# Patient Record
Sex: Female | Born: 1985 | Race: White | Hispanic: No | Marital: Single | Smoking: Never smoker
Health system: Southern US, Community
[De-identification: ages and names within clinical notes are randomized; demographics above are authoritative.]

## PROBLEM LIST (undated history)

## (undated) DIAGNOSIS — R6250 Unspecified lack of expected normal physiological development in childhood: Secondary | ICD-10-CM

## (undated) DIAGNOSIS — G43909 Migraine, unspecified, not intractable, without status migrainosus: Secondary | ICD-10-CM

## (undated) HISTORY — PX: TONSILLECTOMY: SUR1361

## (undated) HISTORY — PX: CYSTECTOMY: SUR359

---
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
## 2007-12-29 ENCOUNTER — Other Ambulatory Visit: Admission: RE | Admit: 2007-12-29 | Discharge: 2007-12-29 | Payer: Self-pay | Admitting: Family Medicine

## 2009-03-03 ENCOUNTER — Emergency Department (HOSPITAL_BASED_OUTPATIENT_CLINIC_OR_DEPARTMENT_OTHER): Admission: EM | Admit: 2009-03-03 | Discharge: 2009-03-03 | Payer: Self-pay | Admitting: Emergency Medicine

## 2009-03-03 ENCOUNTER — Ambulatory Visit: Payer: Self-pay | Admitting: Diagnostic Radiology

## 2009-09-30 ENCOUNTER — Emergency Department (HOSPITAL_BASED_OUTPATIENT_CLINIC_OR_DEPARTMENT_OTHER): Admission: EM | Admit: 2009-09-30 | Discharge: 2009-09-30 | Payer: Self-pay | Admitting: Emergency Medicine

## 2009-12-08 ENCOUNTER — Ambulatory Visit: Payer: Self-pay | Admitting: Diagnostic Radiology

## 2009-12-08 ENCOUNTER — Emergency Department (HOSPITAL_BASED_OUTPATIENT_CLINIC_OR_DEPARTMENT_OTHER): Admission: EM | Admit: 2009-12-08 | Discharge: 2009-12-08 | Payer: Self-pay | Admitting: Emergency Medicine

## 2010-05-11 ENCOUNTER — Emergency Department (HOSPITAL_BASED_OUTPATIENT_CLINIC_OR_DEPARTMENT_OTHER)
Admission: EM | Admit: 2010-05-11 | Discharge: 2010-05-11 | Payer: Self-pay | Source: Home / Self Care | Admitting: Emergency Medicine

## 2011-01-29 LAB — URINALYSIS, ROUTINE W REFLEX MICROSCOPIC
Bilirubin Urine: NEGATIVE
Glucose, UA: NEGATIVE mg/dL
Ketones, ur: >80 mg/dL — AB
Nitrite: NEGATIVE
Protein, ur: NEGATIVE mg/dL
Specific Gravity, Urine: 1.021 (ref 1.005–1.030)
Urobilinogen, UA: 0.2 mg/dL (ref 0.0–1.0)
pH: 6 (ref 5.0–8.0)

## 2011-01-29 LAB — COMPREHENSIVE METABOLIC PANEL
ALT: 32 U/L (ref 0–35)
AST: 22 U/L (ref 0–37)
Albumin: 5.5 g/dL — ABNORMAL HIGH (ref 3.5–5.2)
Alkaline Phosphatase: 66 U/L (ref 39–117)
BUN: 24 mg/dL — ABNORMAL HIGH (ref 6–23)
CO2: 20 mEq/L (ref 19–32)
Calcium: 10.4 mg/dL (ref 8.4–10.5)
Chloride: 103 mEq/L (ref 96–112)
Creatinine, Ser: 0.6 mg/dL (ref 0.4–1.2)
GFR calc Af Amer: >60 mL/min (ref >60)
GFR calc non Af Amer: >60 mL/min (ref >60)
Glucose, Bld: 132 mg/dL — ABNORMAL HIGH (ref 70–99)
Potassium: 4.2 mEq/L (ref 3.5–5.1)
Sodium: 145 mEq/L (ref 135–145)
Total Bilirubin: 0.8 mg/dL (ref 0.3–1.2)
Total Protein: 8.9 g/dL — ABNORMAL HIGH (ref 6.0–8.3)

## 2011-01-29 LAB — URINE MICROSCOPIC-ADD ON

## 2011-01-29 LAB — CBC
HCT: 41.4 % (ref 36.0–46.0)
Hemoglobin: 14.2 g/dL (ref 12.0–15.0)
MCHC: 34.2 g/dL (ref 30.0–36.0)
MCV: 85.2 fL (ref 78.0–100.0)
Platelets: 308 10*3/uL (ref 150–400)
RBC: 4.86 MIL/uL (ref 3.87–5.11)
RDW: 11.4 % — ABNORMAL LOW (ref 11.5–15.5)
WBC: 9.6 10*3/uL (ref 4.0–10.5)

## 2011-01-29 LAB — DIFFERENTIAL
Basophils Absolute: 0.2 10*3/uL — ABNORMAL HIGH (ref 0.0–0.1)
Basophils Relative: 2 % — ABNORMAL HIGH (ref 0–1)
Eosinophils Absolute: 0 10*3/uL (ref 0.0–0.7)
Eosinophils Relative: 0 % (ref 0–5)
Lymphocytes Relative: 13 % (ref 12–46)
Lymphs Abs: 1.3 10*3/uL (ref 0.7–4.0)
Monocytes Absolute: 0.3 10*3/uL (ref 0.1–1.0)
Monocytes Relative: 3 % (ref 3–12)
Neutro Abs: 7.8 10*3/uL — ABNORMAL HIGH (ref 1.7–7.7)
Neutrophils Relative %: 82 % — ABNORMAL HIGH (ref 43–77)

## 2011-01-29 LAB — PREGNANCY, URINE: Preg Test, Ur: NEGATIVE

## 2011-03-15 NOTE — Op Note (Signed)
[place]. [facility]  Patient:    Bianca Scott, Bianca Scott                          MRN: [id number] Proc. Date: 05/21/01 Attending:  Dorthula Matas, D.D.S.                           Operative Report  PREOPERATIVE DIAGNOSIS:  Radiolucency of left mandible.  POSTOPERATIVE DIAGNOSIS:  Radiolucency of left mandible.  OPERATION PERFORMED:  Exploration of left mandible and removal of loose primary tooth L.  SURGEON:  Dorthula Matas, D.D.S.  ASSISTANT:  ANESTHESIA:  General orotracheal.  CULTURES:  None.  DRAINS:  None.  SPECIMENS:  Multiple fragments from intrabony cavity of the left mandible were submitted; tooth L not submitted but given to parents.  COMPLICATIONS:  None.  DESCRIPTION OF PROCEDURE:  The patient was brought to the operating room and placed on the operating table in supine position.  She was then placed under general anesthesia and a oral endotracheal tube was inserted.  The patient was maintained under general anesthesia and prepped and draped in sterile manner for oromaxillary surgical procedure with a moist throat pack being placed. 1.8 cc of 2% lidocaine with 1:100,000 epinephrine was used to infiltrate in the left mandibular vestibule as well as give a mental nerve block and to give an inferior alveolar nerve block.  At this point a 15 blade was used to make an incision along the buccal sulcus of teeth K, L, 22 and 23 with a vertical release made at the mesial aspect of 23.  A full thickness mucoperiosteal flap was reflected.  The mental foramen was identified and it was noted just anterior and just superior to the mental foramen area.  There was a darkness of the bone.  Tooth L was noted to be extremely loose and was removed just with a periosteal elevator.  The drill with a small 6 round bur was used to go through the outer cortical plate of the left mandible over the darkened area. This dropped into a bony cavity which measured 8 mm  deep and approximately 6 mm wide and was approximately 8 mm in height.  There was some old blood in this cavity and as I curetted the cavity, there were some small fragments of soft tissue which were removed and were submitted to [facility] pathology for review.  Once I had entered the cavity and opened up the area to examine it well, I then irrigated it with sterile saline.  The area was suctioned free of debris.  No further soft tissue remnants were noted.  At this point I then placed the flap back in position and used multiple interrupted 4-0 Vicryl sutures to reapproximate the soft tissue flap.  At this point the surgical procedure was completed.  The oral cavity was irrigated and suctioned free of fluid.  The throat pack was removed.  The patient was awakened in the operating room and transferred to the recovery room where she was found to be in satisfactory postoperative condition. DD:  05/21/01 TD:  05/21/01 Job: [id number]

## 2011-03-15 NOTE — Op Note (Signed)
[place]. [facility]  Patient:    ZAKYRIA, METZINGER                         MRN: [id number] Proc. Date: 05/21/01 Adm. Date:  05/21/01 Attending:  Dorthula Matas, D.D.S.                           Operative Report  PLACE OF SERVICE:  [facility].  SURGEON:  Golden Pop, D.D.S.  BRIEF HISTORY:  Lycia Sachdeva is a 25 year old white female who has a radiolucency of the left mandible as noted by her orthodontist on a [facility] x-ray.  She was referred to me and we confirmed with a periapical x-ray that in fact there was a enlarged radiolucency in the area of tooth #22, and #21.  Also, noted was that primary tooth L was loose and I encouraged the patient to try to remove this tooth prior to the surgery.  I also discussed with the parents that if the tooth is not gone I would probably remove it because it was so loose I was afraid that she might aspirate it in the immediate post surgical time.  Sarah, in general, is in fairly good health but she has been known to have some chronic recurring headaches.  She also has been followed by Dr. Alden Server L. Peter [facility], but apparently she mentally is not in the same age range as she is developmentally.  She is not a candidate to have the surgical procedure done in the office and she also has somewhat of a restricted opening and very tight perioral musculature.  The plan is to explore the left mandible and if this is a cyst, to remove it.  At this point I suspect a dentigerous cyst or a traumatic bone cyst.  There is also a good chance that we will removing primary tooth L since it is loose if the patient has not already taken it out.  The patient will be seen by me postoperatively for follow up. DD:  05/21/01 TD:  05/21/01 Job: [id number]

## 2011-04-17 ENCOUNTER — Emergency Department (HOSPITAL_BASED_OUTPATIENT_CLINIC_OR_DEPARTMENT_OTHER)
Admission: EM | Admit: 2011-04-17 | Discharge: 2011-04-17 | Disposition: A | Discharge status: Home / Self Care | Payer: 59 | Attending: Emergency Medicine | Admitting: Emergency Medicine

## 2011-04-17 ENCOUNTER — Emergency Department (INDEPENDENT_AMBULATORY_CARE_PROVIDER_SITE_OTHER): Payer: 59

## 2011-04-17 DIAGNOSIS — R059 Cough, unspecified: Secondary | ICD-10-CM | POA: Insufficient documentation

## 2011-04-17 DIAGNOSIS — R05 Cough: Secondary | ICD-10-CM | POA: Insufficient documentation

## 2011-04-17 DIAGNOSIS — M549 Dorsalgia, unspecified: Secondary | ICD-10-CM | POA: Insufficient documentation

## 2011-04-17 LAB — URINALYSIS, ROUTINE W REFLEX MICROSCOPIC
Bilirubin Urine: NEGATIVE
Glucose, UA: NEGATIVE mg/dL
Hgb urine dipstick: NEGATIVE
Ketones, ur: NEGATIVE mg/dL
Nitrite: NEGATIVE
Protein, ur: NEGATIVE mg/dL
Specific Gravity, Urine: 1.011 (ref 1.005–1.030)
Urobilinogen, UA: 0.2 mg/dL (ref 0.0–1.0)
pH: 7 (ref 5.0–8.0)

## 2011-04-17 LAB — URINE MICROSCOPIC-ADD ON

## 2011-04-17 LAB — PREGNANCY, URINE: Preg Test, Ur: NEGATIVE

## 2011-07-11 ENCOUNTER — Emergency Department (INDEPENDENT_AMBULATORY_CARE_PROVIDER_SITE_OTHER): Payer: 59

## 2011-07-11 ENCOUNTER — Encounter: Payer: Self-pay | Admitting: *Deleted

## 2011-07-11 ENCOUNTER — Emergency Department (HOSPITAL_BASED_OUTPATIENT_CLINIC_OR_DEPARTMENT_OTHER)
Admission: EM | Admit: 2011-07-11 | Discharge: 2011-07-11 | Disposition: A | Discharge status: Home / Self Care | Payer: 59 | Attending: Emergency Medicine | Admitting: Emergency Medicine

## 2011-07-11 DIAGNOSIS — Y9355 Activity, bike riding: Secondary | ICD-10-CM | POA: Insufficient documentation

## 2011-07-11 DIAGNOSIS — S9030XA Contusion of unspecified foot, initial encounter: Secondary | ICD-10-CM

## 2011-07-11 DIAGNOSIS — M25579 Pain in unspecified ankle and joints of unspecified foot: Secondary | ICD-10-CM

## 2011-07-11 DIAGNOSIS — IMO0002 Reserved for concepts with insufficient information to code with codable children: Secondary | ICD-10-CM | POA: Insufficient documentation

## 2011-07-11 DIAGNOSIS — X58XXXA Exposure to other specified factors, initial encounter: Secondary | ICD-10-CM

## 2011-07-11 DIAGNOSIS — Y9289 Other specified places as the place of occurrence of the external cause: Secondary | ICD-10-CM | POA: Insufficient documentation
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
## 2011-07-11 NOTE — ED Notes (Signed)
Pt reports injuring right foot last week large bruised area noted on top of right foot pt limping states kept thinking it would get better but is still very painful

## 2011-07-11 NOTE — ED Provider Notes (Signed)
History     CSN: [id number] Arrival date & time: 07/11/2011  5:55 PM   Chief Complaint  Patient presents with  . Foot Injury     (Include location/radiation/quality/duration/timing/severity/associated sxs/prior treatment) Patient is a 25 y.o. female presenting with foot injury. The history is provided by the patient. No language interpreter was used.  Foot Injury  The incident occurred more than 1 week ago. The incident occurred at home. There was no injury mechanism. The pain is present in the right foot. The pain is at a severity of 7/10. The pain is moderate. The pain has been constant since onset. She reports no foreign bodies present. She has tried nothing for the symptoms. The treatment provided moderate relief.  Pt complains of pain in [place].  Pt complains of bruising and swelling.   History reviewed. No pertinent past medical history.   Past Surgical History  Procedure Date  . Cystectomy   . Tonsillectomy     No family history on file.  History  Substance Use Topics  . Smoking status: Never Smoker   . Smokeless tobacco: Not on file  . Alcohol Use: Yes    OB History    Grav Para Term Preterm Abortions TAB SAB Ect Mult Living                  Review of Systems  Musculoskeletal: Positive for myalgias and joint swelling.  All other systems reviewed and are negative.    Allergies  Ventolin and Duricef  Home Medications   Current Outpatient Rx  Name Route Sig Dispense Refill  . AMOXICILLIN 125 MG PO CHEW Oral Chew 125 mg by mouth 3 (three) times daily. Unknown dosage     . AMOXICILLIN PO Oral Take 1 capsule by mouth 2 (two) times daily.      . IBUPROFEN 200 MG PO TABS Oral Take 400 mg by mouth 2 (two) times daily as needed. For pain     . CYPROHEPTADINE HCL 4 MG PO TABS Oral Take 4 mg by mouth 2 (two) times daily as needed. For migraine       Physical Exam    BP 123/72  Pulse 84  Temp(Src) 98.6 F (37  C) (Oral)  Resp 16  SpO2 100%  LMP 06/21/2011  Physical Exam  Nursing note and vitals reviewed. Constitutional: She is oriented to person, place, and time. She appears well-developed and well-nourished.  HENT:  Head: Normocephalic.  Musculoskeletal: She exhibits edema and tenderness.       Bruised mid foot,  From,  nv and ns intact  Neurological: She is alert and oriented to person, place, and time.  Skin: Skin is warm. There is erythema.  Psychiatric: She has a normal mood and affect.    ED Course  Procedures  Results for orders placed during the hospital encounter of 04/17/11  PREGNANCY, URINE      Component Value Range   Preg Test, Ur NEGATIVE    URINALYSIS, ROUTINE W REFLEX MICROSCOPIC      Component Value Range   Color, Urine YELLOW  YELLOW    Appearance CLEAR  CLEAR    Specific Gravity, Urine 1.011  1.005 - 1.030    pH 7.0  5.0 - 8.0    Glucose, UA NEGATIVE  NEGATIVE (mg/dL)   Hgb urine dipstick NEGATIVE  NEGATIVE    Bilirubin Urine NEGATIVE  NEGATIVE    Ketones, ur NEGATIVE  NEGATIVE (mg/dL)  Protein, ur NEGATIVE  NEGATIVE (mg/dL)   Urobilinogen, UA 0.2  0.0 - 1.0 (mg/dL)   Nitrite NEGATIVE  NEGATIVE    Leukocytes, UA TRACE (*) NEGATIVE   URINE MICROSCOPIC-ADD ON      Component Value Range   Squamous Epithelial / LPF RARE  RARE    WBC, UA 0-2  <3 (WBC/hpf)   Bacteria, UA RARE  RARE    Dg Foot Complete Right  07/11/2011  *RADIOLOGY REPORT*  Clinical Data: Foot injury.  Foot pain and bruising.  RIGHT FOOT COMPLETE - 3+ VIEW  Comparison:  None.  Findings:  There is no evidence of fracture or dislocation.  There is no evidence of arthropathy or other focal bone abnormality. Soft tissues are unremarkable.  IMPRESSION: Negative.  Original Report Authenticated By: Danae Orleans, M.D.     No diagnosis found.   MDM No fx,  Pt ace wrapped and placed in a post op shoe.       Langston Masker, [place] 07/11/11 1900

## 2011-07-15 NOTE — ED Provider Notes (Signed)
Medical screening examination/treatment/procedure(s) were performed by non-physician practitioner and as supervising physician I was immediately available for consultation/collaboration.   Jadence Kinlaw A Amadu Schlageter, MD 07/15/11 0813 

## 2011-11-30 ENCOUNTER — Emergency Department (HOSPITAL_BASED_OUTPATIENT_CLINIC_OR_DEPARTMENT_OTHER)
Admission: EM | Admit: 2011-11-30 | Discharge: 2011-11-30 | Disposition: A | Discharge status: Home / Self Care | Payer: 59 | Attending: Emergency Medicine | Admitting: Emergency Medicine

## 2011-11-30 ENCOUNTER — Encounter (HOSPITAL_BASED_OUTPATIENT_CLINIC_OR_DEPARTMENT_OTHER): Payer: Self-pay | Admitting: *Deleted

## 2011-11-30 DIAGNOSIS — R109 Unspecified abdominal pain: Secondary | ICD-10-CM | POA: Insufficient documentation

## 2011-11-30 DIAGNOSIS — N390 Urinary tract infection, site not specified: Secondary | ICD-10-CM | POA: Insufficient documentation

## 2011-11-30 DIAGNOSIS — R1110 Vomiting, unspecified: Secondary | ICD-10-CM | POA: Insufficient documentation

## 2011-11-30 HISTORY — DX: Unspecified lack of expected normal physiological development in childhood: R62.50

## 2011-11-30 LAB — URINALYSIS, ROUTINE W REFLEX MICROSCOPIC
Bilirubin Urine: NEGATIVE
Glucose, UA: NEGATIVE mg/dL
Ketones, ur: 40 mg/dL — AB
Nitrite: NEGATIVE
Protein, ur: >300 mg/dL — AB
Specific Gravity, Urine: 1.02 (ref 1.005–1.030)
Urobilinogen, UA: 1 mg/dL (ref 0.0–1.0)
pH: 7.5 (ref 5.0–8.0)

## 2011-11-30 LAB — BASIC METABOLIC PANEL
BUN: 12 mg/dL (ref 6–23)
CO2: 26 mEq/L (ref 19–32)
Calcium: 10.1 mg/dL (ref 8.4–10.5)
Chloride: 104 mEq/L (ref 96–112)
Creatinine, Ser: 0.7 mg/dL (ref 0.50–1.10)
GFR calc Af Amer: >90 mL/min (ref >90)
GFR calc non Af Amer: >90 mL/min (ref >90)
Glucose, Bld: 111 mg/dL — ABNORMAL HIGH (ref 70–99)
Potassium: 3.9 mEq/L (ref 3.5–5.1)
Sodium: 141 mEq/L (ref 135–145)

## 2011-11-30 LAB — PREGNANCY, URINE: Preg Test, Ur: NEGATIVE

## 2011-11-30 LAB — WET PREP, GENITAL
Trich, Wet Prep: NONE SEEN
Yeast Wet Prep HPF POC: NONE SEEN

## 2011-11-30 LAB — URINE MICROSCOPIC-ADD ON

## 2011-11-30 MED ORDER — SODIUM CHLORIDE 0.9 % IV BOLUS (SEPSIS)
1000.0000 mL | Freq: Once | INTRAVENOUS | Status: AC
  Administered 2011-11-30: 1000 mL via INTRAVENOUS

## 2011-11-30 MED ORDER — ONDANSETRON HCL 4 MG/2ML IJ SOLN
4.0000 mg | Freq: Once | INTRAMUSCULAR | Status: AC
  Administered 2011-11-30: 4 mg via INTRAVENOUS
  Filled 2011-11-30: qty 2

## 2011-11-30 MED ORDER — ONDANSETRON 4 MG PO TBDP: 4.0000 mg | ORAL_TABLET | Freq: Three times a day (TID) | ORAL | Status: AC | PRN

## 2011-11-30 MED ORDER — CIPROFLOXACIN HCL 500 MG PO TABS: 500.0000 mg | ORAL_TABLET | Freq: Two times a day (BID) | ORAL | Status: AC

## 2011-11-30 NOTE — ED Provider Notes (Signed)
History     CSN: [id number]  Arrival date & time 11/30/11  1349   First MD Initiated Contact with Patient 11/30/11 1418      Chief Complaint  Patient presents with  . Flank Pain    (Consider location/radiation/quality/duration/timing/severity/associated sxs/prior treatment) HPI Comments: Pt states that she is currently on her period:pt states that before she started she noticed a yellow discharge  Patient is a 26 y.o. female presenting with flank pain. The history is provided by the patient and a parent. No language interpreter was used.  Flank Pain This is a new problem. The current episode started today. The problem occurs constantly. The problem has been unchanged. Associated symptoms include abdominal pain and vomiting. Pertinent negatives include no fever. The symptoms are aggravated by nothing. She has tried nothing for the symptoms.    Past Medical History  Diagnosis Date  . Development delay     Past Surgical History  Procedure Date  . Cystectomy   . Tonsillectomy     History reviewed. No pertinent family history.  History  Substance Use Topics  . Smoking status: Never Smoker   . Smokeless tobacco: Not on file  . Alcohol Use: Yes    OB History    Grav Para Term Preterm Abortions TAB SAB Ect Mult Living                  Review of Systems  Constitutional: Negative for fever.  Gastrointestinal: Positive for vomiting and abdominal pain.  Genitourinary: Positive for flank pain.  All other systems reviewed and are negative.    Allergies  Ventolin and Duricef  Home Medications  No current outpatient prescriptions on file.  BP 121/76  Pulse 62  Temp(Src) 97.7 F (36.5 C) (Oral)  Resp 18  Ht 5\' 2"  (1.575 m)  Wt 87 lb (39.463 kg)  BMI 15.91 kg/m2  SpO2 99%  LMP 11/30/2011  Physical Exam  Nursing note and vitals reviewed. Constitutional: She is oriented to person, place, and time. She appears well-developed and well-nourished.  HENT:  Head:  Normocephalic and atraumatic.  Eyes: Conjunctivae and EOM are normal.  Cardiovascular: Normal rate.   Pulmonary/Chest: Effort normal and breath sounds normal.  Abdominal: Soft. Bowel sounds are normal. There is no tenderness.  Genitourinary: Cervix exhibits no motion tenderness. There is bleeding around the vagina.  Musculoskeletal: Normal range of motion.  Neurological: She is alert and oriented to person, place, and time.  Skin: Skin is dry.  Psychiatric: She has a normal mood and affect.    ED Course  Procedures (including critical care time)  Labs Reviewed  WET PREP, GENITAL - Abnormal; Notable for the following:    Clue Cells Wet Prep HPF POC FEW (*)    WBC, Wet Prep HPF POC FEW (*)    All other components within normal limits  BASIC METABOLIC PANEL - Abnormal; Notable for the following:    Glucose, Bld 111 (*)    All other components within normal limits  URINALYSIS, ROUTINE W REFLEX MICROSCOPIC - Abnormal; Notable for the following:    APPearance CLOUDY (*)    Hgb urine dipstick LARGE (*)    Ketones, ur 40 (*)    Protein, ur >300 (*)    Leukocytes, UA SMALL (*)    All other components within normal limits  URINE MICROSCOPIC-ADD ON - Abnormal; Notable for the following:    Bacteria, UA FEW (*)    All other components within normal limits  PREGNANCY, URINE  URINALYSIS, WITH MICROSCOPIC  PREGNANCY, URINE  GC/CHLAMYDIA PROBE AMP, GENITAL  URINALYSIS, ROUTINE W REFLEX MICROSCOPIC   No results found.   1. UTI (lower urinary tract infection)       MDM  Pt tolerating po and is feeling better at this time:will treat for uti:pt abdominal and pelvic exam unremarkable        Teressa Lower, NP 11/30/11 1909

## 2011-11-30 NOTE — ED Notes (Signed)
Pt states she has had N/V and right flank pain since 1000. Yellow vaginal d/c. Denies UTI s/s.

## 2011-12-01 NOTE — ED Provider Notes (Signed)
Medical screening examination/treatment/procedure(s) were performed by non-physician practitioner and as supervising physician I was immediately available for consultation/collaboration.  Gerhard Munch, MD 12/01/11 [phone]

## 2011-12-02 LAB — GC/CHLAMYDIA PROBE AMP, GENITAL
Chlamydia, DNA Probe: NEGATIVE
GC Probe Amp, Genital: NEGATIVE

## 2012-01-05 ENCOUNTER — Emergency Department (HOSPITAL_BASED_OUTPATIENT_CLINIC_OR_DEPARTMENT_OTHER)
Admission: EM | Admit: 2012-01-05 | Discharge: 2012-01-05 | Disposition: A | Discharge status: Home / Self Care | Payer: 59 | Attending: Emergency Medicine | Admitting: Emergency Medicine

## 2012-01-05 ENCOUNTER — Encounter (HOSPITAL_BASED_OUTPATIENT_CLINIC_OR_DEPARTMENT_OTHER): Payer: Self-pay | Admitting: Emergency Medicine

## 2012-01-05 DIAGNOSIS — IMO0001 Reserved for inherently not codable concepts without codable children: Secondary | ICD-10-CM | POA: Insufficient documentation

## 2012-01-05 DIAGNOSIS — R112 Nausea with vomiting, unspecified: Secondary | ICD-10-CM | POA: Insufficient documentation

## 2012-01-05 DIAGNOSIS — R1110 Vomiting, unspecified: Secondary | ICD-10-CM

## 2012-01-05 DIAGNOSIS — R51 Headache: Secondary | ICD-10-CM | POA: Insufficient documentation

## 2012-01-05 DIAGNOSIS — R6250 Unspecified lack of expected normal physiological development in childhood: Secondary | ICD-10-CM | POA: Insufficient documentation

## 2012-01-05 LAB — URINALYSIS, ROUTINE W REFLEX MICROSCOPIC
Bilirubin Urine: NEGATIVE
Glucose, UA: NEGATIVE mg/dL
Hgb urine dipstick: NEGATIVE
Ketones, ur: 15 mg/dL — AB
Nitrite: NEGATIVE
Protein, ur: NEGATIVE mg/dL
Specific Gravity, Urine: 1.019 (ref 1.005–1.030)
Urobilinogen, UA: 0.2 mg/dL (ref 0.0–1.0)
pH: 8 (ref 5.0–8.0)

## 2012-01-05 LAB — URINE MICROSCOPIC-ADD ON

## 2012-01-05 LAB — BASIC METABOLIC PANEL
BUN: 19 mg/dL (ref 6–23)
CO2: 26 mEq/L (ref 19–32)
Calcium: 10.2 mg/dL (ref 8.4–10.5)
Chloride: 103 mEq/L (ref 96–112)
Creatinine, Ser: 0.6 mg/dL (ref 0.50–1.10)
GFR calc Af Amer: >90 mL/min (ref >90)
GFR calc non Af Amer: >90 mL/min (ref >90)
Glucose, Bld: 140 mg/dL — ABNORMAL HIGH (ref 70–99)
Potassium: 4.2 mEq/L (ref 3.5–5.1)
Sodium: 140 mEq/L (ref 135–145)

## 2012-01-05 LAB — CBC
HCT: 39.3 % (ref 36.0–46.0)
Hemoglobin: 13.8 g/dL (ref 12.0–15.0)
MCH: 29.2 pg (ref 26.0–34.0)
MCHC: 35.1 g/dL (ref 30.0–36.0)
MCV: 83.3 fL (ref 78.0–100.0)
Platelets: 199 10*3/uL (ref 150–400)
RBC: 4.72 MIL/uL (ref 3.87–5.11)
RDW: 12.8 % (ref 11.5–15.5)
WBC: 14 10*3/uL — ABNORMAL HIGH (ref 4.0–10.5)

## 2012-01-05 LAB — DIFFERENTIAL
Basophils Absolute: 0 10*3/uL (ref 0.0–0.1)
Basophils Relative: 0 % (ref 0–1)
Eosinophils Absolute: 0 10*3/uL (ref 0.0–0.7)
Eosinophils Relative: 0 % (ref 0–5)
Lymphocytes Relative: 3 % — ABNORMAL LOW (ref 12–46)
Lymphs Abs: 0.5 10*3/uL — ABNORMAL LOW (ref 0.7–4.0)
Monocytes Absolute: 0.4 10*3/uL (ref 0.1–1.0)
Monocytes Relative: 3 % (ref 3–12)
Neutro Abs: 13.1 10*3/uL — ABNORMAL HIGH (ref 1.7–7.7)
Neutrophils Relative %: 94 % — ABNORMAL HIGH (ref 43–77)

## 2012-01-05 LAB — PREGNANCY, URINE: Preg Test, Ur: NEGATIVE

## 2012-01-05 MED ORDER — ONDANSETRON HCL 4 MG PO TABS: 4.0000 mg | ORAL_TABLET | Freq: Four times a day (QID) | ORAL | Status: AC

## 2012-01-05 MED ORDER — NITROFURANTOIN MONOHYD MACRO 100 MG PO CAPS: 100.0000 mg | ORAL_CAPSULE | Freq: Two times a day (BID) | ORAL | Status: AC

## 2012-01-05 MED ORDER — ONDANSETRON HCL 4 MG/2ML IJ SOLN
4.0000 mg | Freq: Once | INTRAMUSCULAR | Status: AC
  Administered 2012-01-05: 4 mg via INTRAVENOUS

## 2012-01-05 MED ORDER — PROMETHAZINE HCL 25 MG/ML IJ SOLN
12.5000 mg | Freq: Once | INTRAMUSCULAR | Status: AC
  Administered 2012-01-05: 12.5 mg via INTRAVENOUS
  Filled 2012-01-05: qty 1

## 2012-01-05 MED ORDER — SODIUM CHLORIDE 0.9 % IV BOLUS (SEPSIS)
1000.0000 mL | Freq: Once | INTRAVENOUS | Status: AC
  Administered 2012-01-05: 1000 mL via INTRAVENOUS

## 2012-01-05 MED ORDER — KETOROLAC TROMETHAMINE 30 MG/ML IJ SOLN
30.0000 mg | Freq: Once | INTRAMUSCULAR | Status: AC
  Administered 2012-01-05: 30 mg via INTRAVENOUS
  Filled 2012-01-05: qty 1

## 2012-01-05 MED ORDER — ONDANSETRON HCL 4 MG/2ML IJ SOLN
INTRAMUSCULAR | Status: AC
  Administered 2012-01-05: 4 mg via INTRAVENOUS
  Filled 2012-01-05: qty 2

## 2012-01-05 MED ORDER — ONDANSETRON HCL 4 MG/2ML IJ SOLN
4.0000 mg | Freq: Once | INTRAMUSCULAR | Status: AC
  Administered 2012-01-05: 4 mg via INTRAVENOUS
  Filled 2012-01-05: qty 2

## 2012-01-05 NOTE — ED Provider Notes (Signed)
History     CSN: [id number]  Arrival date & time 01/05/12  1125   First MD Initiated Contact with Patient 01/05/12 1156      Chief Complaint  Patient presents with  . Emesis    (Consider location/radiation/quality/duration/timing/severity/associated sxs/prior treatment) Patient is a 26 y.o. female presenting with vomiting. The history is provided by the patient. No language interpreter was used.  Emesis  This is a new problem. The current episode started 12 to 24 hours ago. The problem occurs 2 to 4 times per day. The problem has not changed since onset.The emesis has an appearance of stomach contents. There has been no fever. Associated symptoms include myalgias. Pertinent negatives include no chills and no fever.  Pt complains of vomitting today.  Pt has been around family with similar.  Pt has a history of low weight.  Family concerned that pt will lose even more with illness.  Pt denies diarrhea  Past Medical History  Diagnosis Date  . Development delay     Past Surgical History  Procedure Date  . Cystectomy   . Tonsillectomy     No family history on file.  History  Substance Use Topics  . Smoking status: Never Smoker   . Smokeless tobacco: Not on file  . Alcohol Use: No    OB History    Grav Para Term Preterm Abortions TAB SAB Ect Mult Living                  Review of Systems  Constitutional: Negative for fever and chills.  Gastrointestinal: Positive for nausea and vomiting.  Musculoskeletal: Positive for myalgias.  All other systems reviewed and are negative.    Allergies  Ventolin and Duricef  Home Medications  No current outpatient prescriptions on file.  BP 122/75  Pulse 96  Temp(Src) 98.1 F (36.7 C) (Oral)  Resp 16  SpO2 100%  LMP 12/28/2011  Physical Exam  Nursing note and vitals reviewed. Constitutional: She appears well-developed and well-nourished.  HENT:  Head: Normocephalic.  Right Ear: External ear normal.  Left Ear:  External ear normal.  Nose: Nose normal.  Mouth/Throat: Oropharynx is clear and moist.  Eyes: Conjunctivae and EOM are normal. Pupils are equal, round, and reactive to light.  Neck: Normal range of motion.  Cardiovascular: Normal rate.   Pulmonary/Chest: Effort normal.  Abdominal: Soft. There is no tenderness.  Musculoskeletal: Normal range of motion.  Neurological: She is alert.  Skin: Skin is warm.  Psychiatric: She has a normal mood and affect.    ED Course  Procedures (including critical care time)  Labs Reviewed  BASIC METABOLIC PANEL - Abnormal; Notable for the following:    Glucose, Bld 140 (*)    All other components within normal limits  CBC - Abnormal; Notable for the following:    WBC 14.0 (*)    All other components within normal limits  DIFFERENTIAL - Abnormal; Notable for the following:    Neutrophils Relative 94 (*)    Neutro Abs 13.1 (*)    Lymphocytes Relative 3 (*)    Lymphs Abs 0.5 (*)    All other components within normal limits  PREGNANCY, URINE  URINALYSIS, ROUTINE W REFLEX MICROSCOPIC   No results found.   No diagnosis found.    MDM  IV fluids x 2 liters,  Pt given zofran Iv with some relief.  Pt given torodol and pheneragn for headache.  Urine shows possible uti.        Verlon Au  Verline Lema, [place] 01/05/12 1821

## 2012-01-05 NOTE — ED Notes (Signed)
Sprite provided for po challenge 

## 2012-01-05 NOTE — ED Notes (Signed)
Pt vomited moderate amt green liquid emesis

## 2012-01-05 NOTE — ED Notes (Signed)
Pt c/o vomiting since 0500; low back pain since Wed

## 2012-01-05 NOTE — Discharge Instructions (Signed)
Nausea and Vomiting  Nausea is a sick feeling that often comes before throwing up (vomiting). Vomiting is a reflex where stomach contents come out of your mouth. Vomiting can cause severe loss of body fluids (dehydration). Children and elderly adults can become dehydrated quickly, especially if they also have diarrhea. Nausea and vomiting are symptoms of a condition or disease. It is important to find the cause of your symptoms.  CAUSES    Direct irritation of the stomach lining. This irritation can result from increased acid production (gastroesophageal reflux disease), infection, food poisoning, taking certain medicines (such as nonsteroidal anti-inflammatory drugs), alcohol use, or tobacco use.   Signals from the brain.These signals could be caused by a headache, heat exposure, an inner ear disturbance, increased pressure in the brain from injury, infection, a tumor, or a concussion, pain, emotional stimulus, or metabolic problems.   An obstruction in the gastrointestinal tract (bowel obstruction).   Illnesses such as diabetes, hepatitis, gallbladder problems, appendicitis, kidney problems, cancer, sepsis, atypical symptoms of a heart attack, or eating disorders.   Medical treatments such as chemotherapy and radiation.   Receiving medicine that makes you sleep (general anesthetic) during surgery.  DIAGNOSIS  Your caregiver may ask for tests to be done if the problems do not improve after a few days. Tests may also be done if symptoms are severe or if the reason for the nausea and vomiting is not clear. Tests may include:   Urine tests.   Blood tests.   Stool tests.   Cultures (to look for evidence of infection).   X-rays or other imaging studies.  Test results can help your caregiver make decisions about treatment or the need for additional tests.  TREATMENT  You need to stay well hydrated. Drink frequently but in small amounts.You may wish to drink water, sports drinks, clear broth, or eat frozen  ice pops or gelatin dessert to help stay hydrated.When you eat, eating slowly may help prevent nausea.There are also some antinausea medicines that may help prevent nausea.  HOME CARE INSTRUCTIONS    Take all medicine as directed by your caregiver.   If you do not have an appetite, do not force yourself to eat. However, you must continue to drink fluids.   If you have an appetite, eat a normal diet unless your caregiver tells you differently.   Eat a variety of complex carbohydrates (rice, wheat, potatoes, bread), lean meats, yogurt, fruits, and vegetables.   Avoid high-fat foods because they are more difficult to digest.   Drink enough water and fluids to keep your urine clear or pale yellow.   If you are dehydrated, ask your caregiver for specific rehydration instructions. Signs of dehydration may include:   Severe thirst.   Dry lips and mouth.   Dizziness.   Dark urine.   Decreasing urine frequency and amount.   Confusion.   Rapid breathing or pulse.  SEEK IMMEDIATE MEDICAL CARE IF:    You have blood or brown flecks (like coffee grounds) in your vomit.   You have black or bloody stools.   You have a severe headache or stiff neck.   You are confused.   You have severe abdominal pain.   You have chest pain or trouble breathing.   You do not urinate at least once every 8 hours.   You develop cold or clammy skin.   You continue to vomit for longer than 24 to 48 hours.   You have a fever.  MAKE SURE YOU:      Understand these instructions.   Will watch your condition.   Will get help right away if you are not doing well or get worse.  Document Released: 10/14/2005 Document Revised: 10/03/2011 Document Reviewed: 03/13/2011  [employer] Patient Information 2012 [employer].

## 2012-01-07 NOTE — ED Provider Notes (Signed)
Medical screening examination/treatment/procedure(s) were performed by non-physician practitioner and as supervising physician I was immediately available for consultation/collaboration.   Gwyneth Sprout, MD 01/07/12 1131

## 2012-11-23 ENCOUNTER — Encounter (HOSPITAL_BASED_OUTPATIENT_CLINIC_OR_DEPARTMENT_OTHER): Payer: Self-pay | Admitting: *Deleted

## 2012-11-23 ENCOUNTER — Emergency Department (HOSPITAL_BASED_OUTPATIENT_CLINIC_OR_DEPARTMENT_OTHER)
Admission: EM | Admit: 2012-11-23 | Discharge: 2012-11-23 | Disposition: A | Discharge status: Home / Self Care | Payer: 59 | Attending: Emergency Medicine | Admitting: Emergency Medicine

## 2012-11-23 DIAGNOSIS — Z8679 Personal history of other diseases of the circulatory system: Secondary | ICD-10-CM | POA: Insufficient documentation

## 2012-11-23 DIAGNOSIS — Z8669 Personal history of other diseases of the nervous system and sense organs: Secondary | ICD-10-CM | POA: Insufficient documentation

## 2012-11-23 DIAGNOSIS — E860 Dehydration: Secondary | ICD-10-CM | POA: Insufficient documentation

## 2012-11-23 DIAGNOSIS — R112 Nausea with vomiting, unspecified: Secondary | ICD-10-CM

## 2012-11-23 HISTORY — DX: Migraine, unspecified, not intractable, without status migrainosus: G43.909

## 2012-11-23 LAB — URINALYSIS, ROUTINE W REFLEX MICROSCOPIC
Glucose, UA: NEGATIVE mg/dL
Hgb urine dipstick: NEGATIVE
Ketones, ur: >80 mg/dL — AB
Leukocytes, UA: NEGATIVE
Nitrite: NEGATIVE
Protein, ur: NEGATIVE mg/dL
Specific Gravity, Urine: 1.027 (ref 1.005–1.030)
Urobilinogen, UA: 0.2 mg/dL (ref 0.0–1.0)
pH: 5.5 (ref 5.0–8.0)

## 2012-11-23 LAB — BASIC METABOLIC PANEL
BUN: 26 mg/dL — ABNORMAL HIGH (ref 6–23)
CO2: 19 mEq/L (ref 19–32)
Calcium: 10.2 mg/dL (ref 8.4–10.5)
Chloride: 98 mEq/L (ref 96–112)
Creatinine, Ser: 0.7 mg/dL (ref 0.50–1.10)
GFR calc Af Amer: >90 mL/min (ref >90)
GFR calc non Af Amer: >90 mL/min (ref >90)
Glucose, Bld: 82 mg/dL (ref 70–99)
Potassium: 4.2 mEq/L (ref 3.5–5.1)
Sodium: 139 mEq/L (ref 135–145)

## 2012-11-23 LAB — CBC WITH DIFFERENTIAL/PLATELET
Basophils Absolute: 0 10*3/uL (ref 0.0–0.1)
Basophils Relative: 0 % (ref 0–1)
Eosinophils Absolute: 0 10*3/uL (ref 0.0–0.7)
Eosinophils Relative: 0 % (ref 0–5)
HCT: 42.1 % (ref 36.0–46.0)
Hemoglobin: 14.6 g/dL (ref 12.0–15.0)
Lymphocytes Relative: 16 % (ref 12–46)
Lymphs Abs: 1.6 10*3/uL (ref 0.7–4.0)
MCH: 29 pg (ref 26.0–34.0)
MCHC: 34.7 g/dL (ref 30.0–36.0)
MCV: 83.7 fL (ref 78.0–100.0)
Monocytes Absolute: 0.3 10*3/uL (ref 0.1–1.0)
Monocytes Relative: 3 % (ref 3–12)
Neutro Abs: 7.8 10*3/uL — ABNORMAL HIGH (ref 1.7–7.7)
Neutrophils Relative %: 80 % — ABNORMAL HIGH (ref 43–77)
Platelets: 227 10*3/uL (ref 150–400)
RBC: 5.03 MIL/uL (ref 3.87–5.11)
RDW: 12.7 % (ref 11.5–15.5)
WBC: 9.7 10*3/uL (ref 4.0–10.5)

## 2012-11-23 LAB — PREGNANCY, URINE: Preg Test, Ur: NEGATIVE

## 2012-11-23 MED ORDER — SODIUM CHLORIDE 0.9 % IV BOLUS (SEPSIS)
1000.0000 mL | Freq: Once | INTRAVENOUS | Status: AC
  Administered 2012-11-23: 1000 mL via INTRAVENOUS

## 2012-11-23 MED ORDER — ONDANSETRON HCL 4 MG PO TABS: 4.0000 mg | ORAL_TABLET | Freq: Four times a day (QID) | ORAL | Status: AC

## 2012-11-23 MED ORDER — ONDANSETRON HCL 4 MG/2ML IJ SOLN
4.0000 mg | Freq: Once | INTRAMUSCULAR | Status: AC
  Administered 2012-11-23: 4 mg via INTRAVENOUS
  Filled 2012-11-23: qty 2

## 2012-11-23 NOTE — ED Provider Notes (Signed)
History  This chart was scribed for Ethelda Chick, MD by Shari Heritage, ED Scribe. The patient was seen in room MH09/MH09. Patient's care was started at 1421.  CSN: [id number]  Arrival date & time 11/23/12  1358   First MD Initiated Contact with Patient 11/23/12 1421      Chief Complaint  Patient presents with  . Emesis     Patient is a 27 y.o. female presenting with vomiting. The history is provided by the patient and a parent. No language interpreter was used.  Emesis  This is a new problem. The current episode started yesterday. The problem occurs continuously. The problem has not changed since onset.The emesis has an appearance of stomach contents. There has been no fever. Associated symptoms include chills. Pertinent negatives include no abdominal pain and no diarrhea.    HPI Comments: Bianca Scott is a 27 y.o. female who presents to the Emergency Department complaining of persistent emesis of stomach contents onset suddenly yesterday. There is associated lightheadedness, nausea and chills. Patient denies diarrhea or abdominal pain. Patient is intolerant of liquids and solids. Patient states that vomiting began immediately after she woke up yesterday. She says that she felt fine on Saturday before going to bed. Patient says that she was seen by her PCP earlier today who sent her to the ED for fluids because patient appeared dehydrated. PCP also gave Phenergan 25 mg IM prior to arrival, but it did not improve emesis. Patient and mother state that PCP is also concerned about patient's sudden weight loss over the past several weeks. Patient has a medical history of migraine and chronic fatigue. Patient has been worked up for chronic fatigue, mother states.   Past Medical History  Diagnosis Date  . Development delay   . Migraine     Past Surgical History  Procedure Date  . Cystectomy   . Tonsillectomy     History reviewed. No pertinent family history.  History  Substance Use  Topics  . Smoking status: Never Smoker   . Smokeless tobacco: Not on file  . Alcohol Use: No    OB History    Grav Para Term Preterm Abortions TAB SAB Ect Mult Living                  Review of Systems  Constitutional: Positive for chills.  Gastrointestinal: Positive for nausea and vomiting. Negative for abdominal pain and diarrhea.  Neurological: Positive for light-headedness.  All other systems reviewed and are negative.    Allergies  Ventolin and Duricef  Home Medications   Current Outpatient Rx  Name  Route  Sig  Dispense  Refill  . CETIRIZINE HCL 10 MG PO TABS   Oral   Take 10 mg by mouth daily as needed. For allergies         . DOXYCYCLINE HYCLATE 100 MG PO CAPS   Oral   Take 100 mg by mouth 2 (two) times daily. For 14 days to be completed 01/06/12         . EPINEPHRINE 0.3 MG/0.3ML IJ DEVI   Intramuscular   Inject 0.3 mg into the muscle once.         . IBUPROFEN 200 MG PO TABS   Oral   Take 400 mg by mouth every 6 (six) hours as needed. For cramps         . ONDANSETRON HCL 4 MG PO TABS   Oral   Take 1 tablet (4 mg total) by mouth  every 6 (six) hours.   12 tablet   0   . ONDANSETRON 4 MG PO TBDP   Oral   Take 4 mg by mouth every 8 (eight) hours as needed. For nausea           Triage Vitals: BP 104/60  Pulse 114  Temp 98.4 F (36.9 C) (Oral)  Resp 16  Ht 5\' 3"  (1.6 m)  Wt 84 lb (38.102 kg)  BMI 14.88 kg/m2  SpO2 100%  LMP 11/03/2012  Physical Exam  Nursing note and vitals reviewed. Constitutional: She is oriented to person, place, and time. She appears well-developed and well-nourished. No distress.  HENT:  Head: Normocephalic and atraumatic.  Mouth/Throat: Mucous membranes are dry.  Eyes: Conjunctivae normal and EOM are normal. Pupils are equal, round, and reactive to light.  Neck: Normal range of motion. Neck supple.  Cardiovascular: Normal rate, regular rhythm and normal heart sounds.   Pulmonary/Chest: Effort normal and  breath sounds normal.  Abdominal: Soft. Bowel sounds are normal. She exhibits no distension. There is no tenderness.  Musculoskeletal: Normal range of motion.  Neurological: She is alert and oriented to person, place, and time.  Skin: Skin is warm and dry.  Psychiatric: She has a normal mood and affect.    ED Course  Procedures (including critical care time) DIAGNOSTIC STUDIES: Oxygen Saturation is 100% on room air, normal by my interpretation.    COORDINATION OF CARE: 3:33 PM- Patient here with persistent emesis onset yesterday. Will give injection of zofran and IV fluids. Patient and mother informed of current plan for treatment and evaluation and agrees with plan at this time.   Results for orders placed during the hospital encounter of 11/23/12  URINALYSIS, ROUTINE W REFLEX MICROSCOPIC      Component Value Range   Color, Urine YELLOW  YELLOW   APPearance CLOUDY (*) CLEAR   Specific Gravity, Urine 1.027  1.005 - 1.030   pH 5.5  5.0 - 8.0   Glucose, UA NEGATIVE  NEGATIVE mg/dL   Hgb urine dipstick NEGATIVE  NEGATIVE   Bilirubin Urine SMALL (*) NEGATIVE   Ketones, ur >80 (*) NEGATIVE mg/dL   Protein, ur NEGATIVE  NEGATIVE mg/dL   Urobilinogen, UA 0.2  0.0 - 1.0 mg/dL   Nitrite NEGATIVE  NEGATIVE   Leukocytes, UA NEGATIVE  NEGATIVE  CBC WITH DIFFERENTIAL      Component Value Range   WBC 9.7  4.0 - 10.5 K/uL   RBC 5.03  3.87 - 5.11 MIL/uL   Hemoglobin 14.6  12.0 - 15.0 g/dL   HCT 11.9  14.7 - 82.9 %   MCV 83.7  78.0 - 100.0 fL   MCH 29.0  26.0 - 34.0 pg   MCHC 34.7  30.0 - 36.0 g/dL   RDW 56.2  13.0 - 86.5 %   Platelets 227  150 - 400 K/uL   Neutrophils Relative 80 (*) 43 - 77 %   Neutro Abs 7.8 (*) 1.7 - 7.7 K/uL   Lymphocytes Relative 16  12 - 46 %   Lymphs Abs 1.6  0.7 - 4.0 K/uL   Monocytes Relative 3  3 - 12 %   Monocytes Absolute 0.3  0.1 - 1.0 K/uL   Eosinophils Relative 0  0 - 5 %   Eosinophils Absolute 0.0  0.0 - 0.7 K/uL   Basophils Relative 0  0 - 1 %    Basophils Absolute 0.0  0.0 - 0.1 K/uL  BASIC METABOLIC PANEL  Component Value Range   Sodium 139  135 - 145 mEq/L   Potassium 4.2  3.5 - 5.1 mEq/L   Chloride 98  96 - 112 mEq/L   CO2 19  19 - 32 mEq/L   Glucose, Bld 82  70 - 99 mg/dL   BUN 26 (*) 6 - 23 mg/dL   Creatinine, Ser 4.09  0.50 - 1.10 mg/dL   Calcium 81.1  8.4 - 91.4 mg/dL   GFR calc non Af Amer >90  >90 mL/min   GFR calc Af Amer >90  >90 mL/min  PREGNANCY, URINE      Component Value Range   Preg Test, Ur NEGATIVE  NEGATIVE    No results found.   1. Nausea and vomiting   2. Dehydration       MDM  Pt presenting with multiple episodes of emesis beginning yesterday morning, not able to keep down liquids.  No abdominal tenderness.  Pt has been hydrated with IV fluids, after phenergan at PCP at now zofran in the ED she is tolerating fluids by mouth.  Will discharge with rx for zofran.  Prior records reviewed.  Discharged with strict return precautions.  Pt agreeable with plan.      I personally performed the services described in this documentation, which was scribed in my presence. The recorded information has been reviewed and is accurate.    Ethelda Chick, MD 11/23/12 1728

## 2012-11-23 NOTE — ED Notes (Signed)
Pt assisted to restroom, unable to void at this time, will recheck shortly.

## 2012-11-23 NOTE — ED Notes (Signed)
Pt c/o dizziness and vomiting x 2 days, sent here from [facility] office for eval , PHenergan 25mg  IM given PTA

## 2013-02-25 ENCOUNTER — Emergency Department (HOSPITAL_BASED_OUTPATIENT_CLINIC_OR_DEPARTMENT_OTHER)
Admission: EM | Admit: 2013-02-25 | Discharge: 2013-02-25 | Disposition: A | Discharge status: Home / Self Care | Payer: 59 | Attending: Emergency Medicine | Admitting: Emergency Medicine

## 2013-02-25 ENCOUNTER — Emergency Department (HOSPITAL_BASED_OUTPATIENT_CLINIC_OR_DEPARTMENT_OTHER): Payer: 59

## 2013-02-25 ENCOUNTER — Encounter (HOSPITAL_BASED_OUTPATIENT_CLINIC_OR_DEPARTMENT_OTHER): Payer: Self-pay

## 2013-02-25 DIAGNOSIS — S9030XA Contusion of unspecified foot, initial encounter: Secondary | ICD-10-CM | POA: Insufficient documentation

## 2013-02-25 DIAGNOSIS — Y9239 Other specified sports and athletic area as the place of occurrence of the external cause: Secondary | ICD-10-CM | POA: Insufficient documentation

## 2013-02-25 DIAGNOSIS — W219XXA Striking against or struck by unspecified sports equipment, initial encounter: Secondary | ICD-10-CM | POA: Insufficient documentation

## 2013-02-25 DIAGNOSIS — Y9364 Activity, baseball: Secondary | ICD-10-CM | POA: Insufficient documentation

## 2013-02-25 DIAGNOSIS — S9031XA Contusion of right foot, initial encounter: Secondary | ICD-10-CM

## 2013-02-25 DIAGNOSIS — G43909 Migraine, unspecified, not intractable, without status migrainosus: Secondary | ICD-10-CM | POA: Insufficient documentation

## 2013-02-25 DIAGNOSIS — R6250 Unspecified lack of expected normal physiological development in childhood: Secondary | ICD-10-CM | POA: Insufficient documentation

## 2013-02-25 DIAGNOSIS — Z79899 Other long term (current) drug therapy: Secondary | ICD-10-CM | POA: Insufficient documentation

## 2013-02-25 NOTE — ED Provider Notes (Signed)
History     CSN: [id number]  Arrival date & time 02/25/13  1746   First MD Initiated Contact with Patient 02/25/13 1803      Chief Complaint  Patient presents with  . Foot Injury     HPI Patient was struck on the top of the right foot with a softball 2 days ago.  Has had pain and swelling since then.  Here for further evaluation.  Patient has been in the tori. Past Medical History  Diagnosis Date  . Development delay   . Migraine     Past Surgical History  Procedure Laterality Date  . Cystectomy    . Tonsillectomy      No family history on file.  History  Substance Use Topics  . Smoking status: Never Smoker   . Smokeless tobacco: Not on file  . Alcohol Use: No    OB History   Grav Para Term Preterm Abortions TAB SAB Ect Mult Living                  Review of Systems All other systems reviewed and are negative Allergies  Ventolin and Duricef  Home Medications   Current Outpatient Rx  Name  Route  Sig  Dispense  Refill  . cetirizine (ZYRTEC) 10 MG tablet   Oral   Take 10 mg by mouth daily as needed. For allergies         . doxycycline (VIBRAMYCIN) 100 MG capsule   Oral   Take 100 mg by mouth 2 (two) times daily. For 14 days to be completed 01/06/12         . EPINEPHrine (EPIPEN) 0.3 mg/0.3 mL DEVI   Intramuscular   Inject 0.3 mg into the muscle once.         Marland Kitchen ibuprofen (ADVIL,MOTRIN) 200 MG tablet   Oral   Take 400 mg by mouth every 6 (six) hours as needed. For cramps         . ondansetron (ZOFRAN) 4 MG tablet   Oral   Take 1 tablet (4 mg total) by mouth every 6 (six) hours.   12 tablet   0   . ondansetron (ZOFRAN-ODT) 4 MG disintegrating tablet   Oral   Take 4 mg by mouth every 8 (eight) hours as needed. For nausea           BP 130/74  Pulse 101  Temp(Src) 98.6 F (37 C) (Oral)  Resp 16  Ht 5\' 3"  (1.6 m)  Wt 90 lb (40.824 kg)  BMI 15.95 kg/m2  SpO2 99%  LMP 02/04/2013  Physical Exam  Nursing note and vitals  reviewed. Constitutional: She is oriented to person, place, and time. She appears well-developed and well-nourished. No distress.  HENT:  Head: Normocephalic and atraumatic.  Eyes: Pupils are equal, round, and reactive to light.  Neck: Normal range of motion.  Cardiovascular: Normal rate and intact distal pulses.   Pulmonary/Chest: No respiratory distress.  Abdominal: Normal appearance. She exhibits no distension.  Musculoskeletal:       Feet:  Neurological: She is alert and oriented to person, place, and time. No cranial nerve deficit.  Skin: Skin is warm and dry. No rash noted.  Psychiatric: She has a normal mood and affect. Her behavior is normal.    ED Course  Procedures (including critical care time)  Labs Reviewed - No data to display Dg Foot Complete Right  02/25/2013  *RADIOLOGY REPORT*  Clinical Data: Right foot pain, softball injury  RIGHT FOOT COMPLETE - 3+ VIEW  Comparison: 07/11/2011  Findings: No fracture or dislocation is seen.  The joint spaces are preserved.  The visualized soft tissues are unremarkable.  IMPRESSION: No fracture or dislocation is seen.   Original Report Authenticated By: Charline Bills, M.D.      1. Contusion of foot, right, initial encounter       MDM         Nelia Shi, MD 02/25/13 [phone]

## 2013-02-25 NOTE — ED Notes (Signed)
Right foot injury playing softball 2 days ago

## 2014-09-08 ENCOUNTER — Other Ambulatory Visit: Payer: Self-pay | Admitting: Family Medicine

## 2014-09-08 ENCOUNTER — Ambulatory Visit
Admission: RE | Admit: 2014-09-08 | Discharge: 2014-09-08 | Disposition: A | Discharge status: Home / Self Care | Payer: Medicaid Other | Source: Ambulatory Visit | Attending: Family Medicine | Admitting: Family Medicine

## 2014-09-08 DIAGNOSIS — M25472 Effusion, left ankle: Secondary | ICD-10-CM

## 2015-03-22 IMAGING — CR DG ANKLE COMPLETE 3+V*L*
3 series · 3 of 3 positions shown · non-contrast
Comparison: 07/30/2007.

CLINICAL DATA: Fracillon and ankle 5 days ago.  Initial evaluation.

EXAM:
LEFT ANKLE COMPLETE - 3+ VIEW

[view not recorded (1 of 3)]
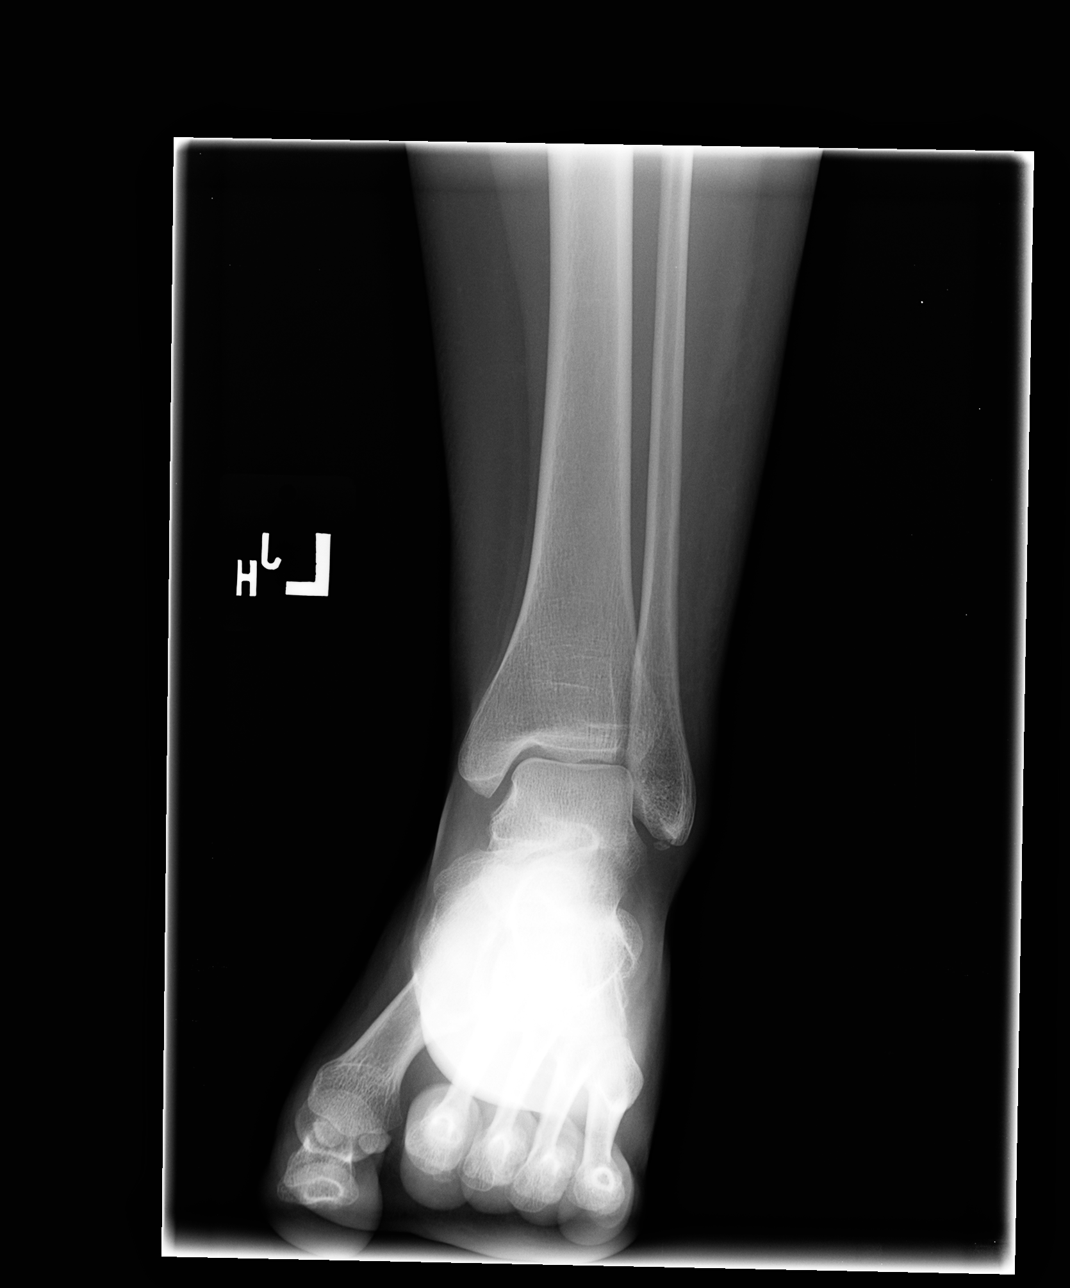

[view not recorded (2 of 3)]
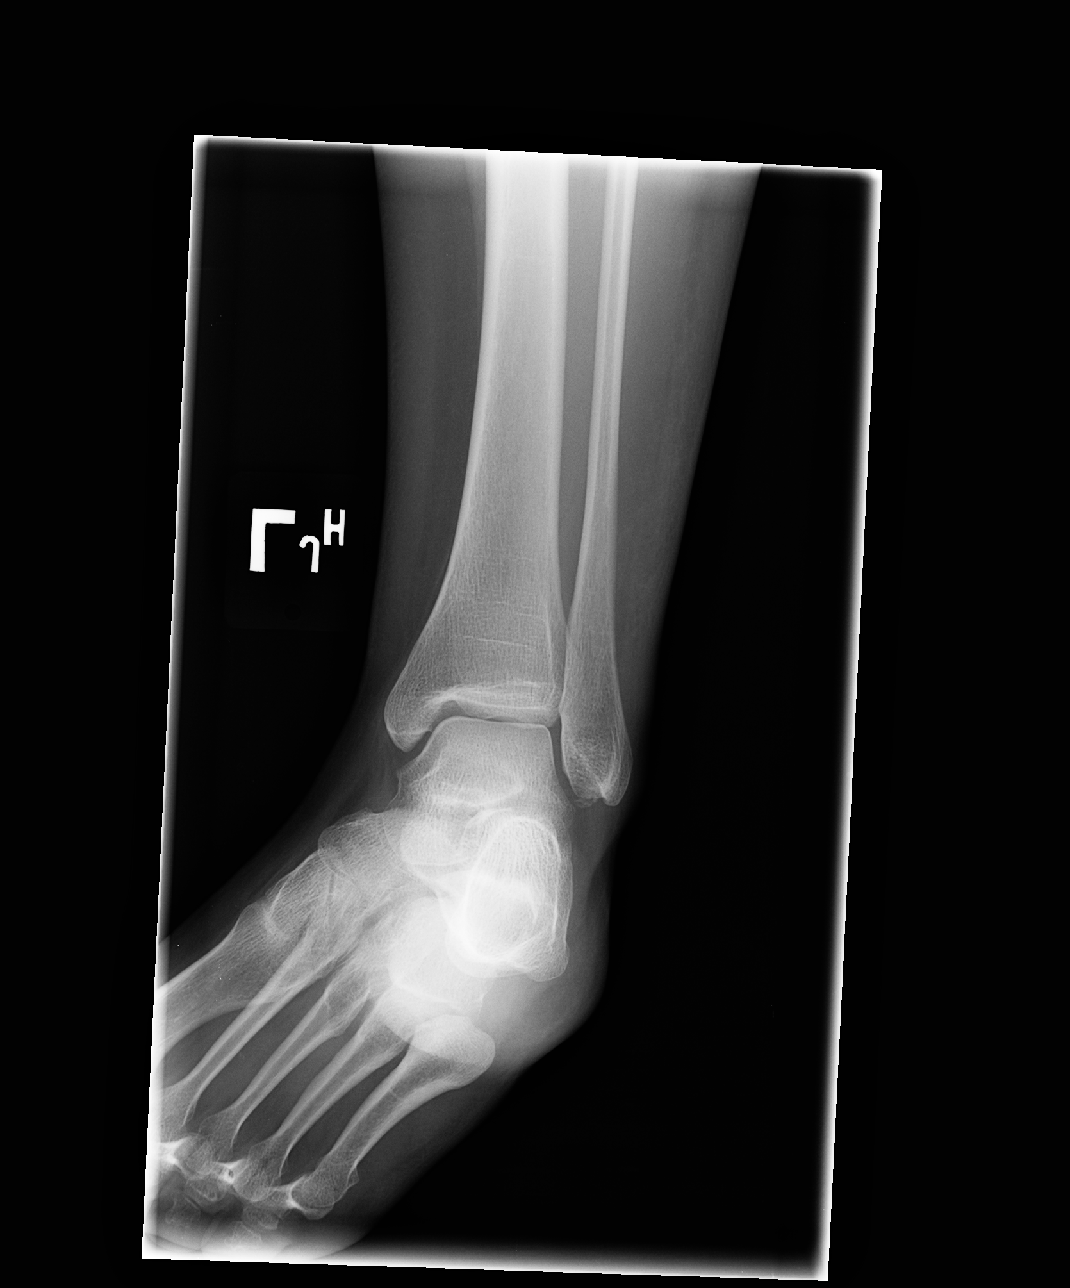

[view not recorded (3 of 3)]
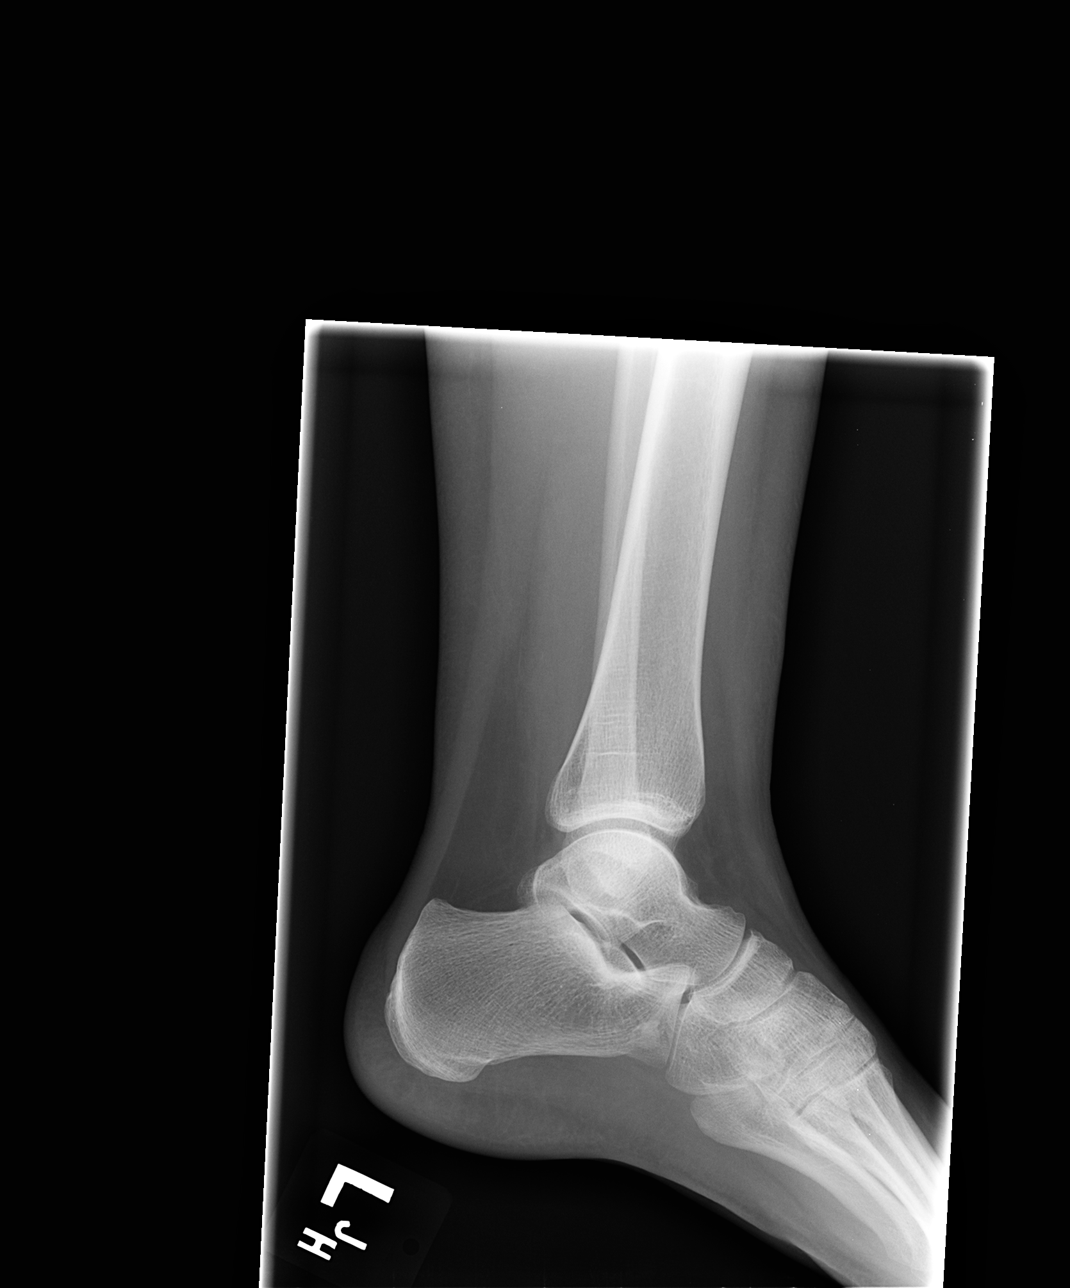

[3 of 3 positions shown; findings below may reference images not displayed]

FINDINGS: Whole fracture chip versus secondary ossification center noted about
the lateral malleolus. This is unchanged no evidence of acute
fracture or dislocation. Small bone island in the calcaneus
unchanged.
IMPRESSION: No acute abnormality. Old avulsion fracture versus non fused
secondary ossification center noted about the lateral malleolus.
This is unchanged from prior 10/ [DATE] study .

## 2016-05-25 ENCOUNTER — Emergency Department (HOSPITAL_BASED_OUTPATIENT_CLINIC_OR_DEPARTMENT_OTHER): Payer: Medicaid Other

## 2016-05-25 ENCOUNTER — Emergency Department (HOSPITAL_BASED_OUTPATIENT_CLINIC_OR_DEPARTMENT_OTHER)
Admission: EM | Admit: 2016-05-25 | Discharge: 2016-05-25 | Disposition: A | Discharge status: Home / Self Care | Payer: Medicaid Other | Attending: Emergency Medicine | Admitting: Emergency Medicine

## 2016-05-25 ENCOUNTER — Encounter (HOSPITAL_BASED_OUTPATIENT_CLINIC_OR_DEPARTMENT_OTHER): Payer: Self-pay | Admitting: Emergency Medicine

## 2016-05-25 DIAGNOSIS — Y999 Unspecified external cause status: Secondary | ICD-10-CM | POA: Diagnosis not present

## 2016-05-25 DIAGNOSIS — Y9301 Activity, walking, marching and hiking: Secondary | ICD-10-CM | POA: Insufficient documentation

## 2016-05-25 DIAGNOSIS — W231XXA Caught, crushed, jammed, or pinched between stationary objects, initial encounter: Secondary | ICD-10-CM | POA: Diagnosis not present

## 2016-05-25 DIAGNOSIS — S92511A Displaced fracture of proximal phalanx of right lesser toe(s), initial encounter for closed fracture: Secondary | ICD-10-CM | POA: Insufficient documentation

## 2016-05-25 DIAGNOSIS — Z791 Long term (current) use of non-steroidal anti-inflammatories (NSAID): Secondary | ICD-10-CM | POA: Diagnosis not present

## 2016-05-25 DIAGNOSIS — S91114A Laceration without foreign body of right lesser toe(s) without damage to nail, initial encounter: Secondary | ICD-10-CM | POA: Diagnosis present

## 2016-05-25 DIAGNOSIS — T148XXA Other injury of unspecified body region, initial encounter: Secondary | ICD-10-CM

## 2016-05-25 DIAGNOSIS — Y929 Unspecified place or not applicable: Secondary | ICD-10-CM | POA: Insufficient documentation

## 2016-05-25 MED ORDER — FENTANYL CITRATE (PF) 100 MCG/2ML IJ SOLN
100.0000 ug | Freq: Once | INTRAMUSCULAR | Status: AC
  Administered 2016-05-25: 100 ug via INTRAVENOUS
  Filled 2016-05-25: qty 2

## 2016-05-25 MED ORDER — LIDOCAINE HCL 2 % IJ SOLN
20.0000 mL | Freq: Once | INTRAMUSCULAR | Status: AC
  Administered 2016-05-25: 400 mg
  Filled 2016-05-25: qty 20

## 2016-05-25 MED ORDER — SULFAMETHOXAZOLE-TRIMETHOPRIM 800-160 MG PO TABS: 1.0000 | ORAL_TABLET | Freq: Two times a day (BID) | ORAL | 0 refills | Status: AC

## 2016-05-25 MED ORDER — OXYCODONE-ACETAMINOPHEN 5-325 MG PO TABS: 1.0000 | ORAL_TABLET | Freq: Four times a day (QID) | ORAL | 0 refills | Status: AC | PRN

## 2016-05-25 MED ORDER — SULFAMETHOXAZOLE-TRIMETHOPRIM 800-160 MG PO TABS
1.0000 | ORAL_TABLET | Freq: Once | ORAL | Status: AC
  Administered 2016-05-25: 1 via ORAL
  Filled 2016-05-25: qty 1

## 2016-05-25 NOTE — Discharge Instructions (Signed)
Please take antibiotics as directed, please use pain medication only as needed for breakthrough pain. Please call Dr. Doroteo Glassman office first thing Monday morning and schedule follow-up evaluation. If he experiences any new or worsening signs or symptoms including infection, please return immediately to the emergency room for further evaluation.

## 2016-05-25 NOTE — ED Notes (Signed)
Patient transported to X-ray 

## 2016-05-25 NOTE — ED Triage Notes (Signed)
Patient reports that she lost her balance and fell while walking down the steps, striking her toe against two wooden posts, laceration to the area between 4th and 5th digits. Patient also has an abrasion to her right elbow.

## 2016-05-25 NOTE — ED Provider Notes (Signed)
[facility] Provider Note   CSN: [id number] Arrival date & time: 05/25/16  1305  First Provider Contact:  None       History   Chief Complaint Chief Complaint  Patient presents with  . Extremity Laceration    HPI Bianca Scott is a 30 y.o. female.  HPI   30 year old female presents today with foot injury. Patient reports that she caught her foot on a wooden posts with noted laceration between the fourth and fifth digits with external rotation and deformity noted of the fifth digit. Bleeding controlled at this time, tetanus up-to-date. No medications prior to arrival. Patient reports that the pain comes and goes presently nonsevere.  Past Medical History:  Diagnosis Date  . Development delay   . Migraine     There are no active problems to display for this patient.   Past Surgical History:  Procedure Laterality Date  . CYSTECTOMY    . TONSILLECTOMY      OB History    No data available     Home Medications    Prior to Admission medications   Medication Sig Start Date End Date Taking? Authorizing Provider  cetirizine (ZYRTEC) 10 MG tablet Take 10 mg by mouth daily as needed. For allergies   Yes Historical Provider, MD  ibuprofen (ADVIL,MOTRIN) 200 MG tablet Take 400 mg by mouth every 6 (six) hours as needed. For cramps   Yes Historical Provider, MD  doxycycline (VIBRAMYCIN) 100 MG capsule Take 100 mg by mouth 2 (two) times daily. For 14 days to be completed 01/06/12    Historical Provider, MD  EPINEPHrine (EPIPEN) 0.3 mg/0.3 mL DEVI Inject 0.3 mg into the muscle once.    Historical Provider, MD  medroxyPROGESTERone (DEPO-PROVERA) 150 MG/ML injection Inject 150 mg into the muscle every 3 (three) months.    Historical Provider, MD  ondansetron (ZOFRAN) 4 MG tablet Take 1 tablet (4 mg total) by mouth every 6 (six) hours. 11/23/12   Jerelyn Scott, MD  ondansetron (ZOFRAN-ODT) 4 MG disintegrating tablet Take 4 mg by mouth every 8 (eight) hours as needed. For  nausea    Historical Provider, MD  oxyCODONE-acetaminophen (PERCOCET/ROXICET) 5-325 MG tablet Take 1 tablet by mouth every 6 (six) hours as needed for severe pain. 05/25/16   Eyvonne Mechanic, PA-C  sulfamethoxazole-trimethoprim (BACTRIM DS,SEPTRA DS) 800-160 MG tablet Take 1 tablet by mouth 2 (two) times daily. 05/25/16 06/01/16  Eyvonne Mechanic, PA-C    Family History History reviewed. No pertinent family history.  Social History Social History  Substance Use Topics  . Smoking status: Never Smoker  . Smokeless tobacco: Never Used  . Alcohol use No     Allergies   Ventolin [kdc:albuterol] and Duricef [cefadroxil monohydrate]   Review of Systems Review of Systems  All other systems reviewed and are negative.    Physical Exam Updated Vital Signs BP 113/76 (BP Location: Left Arm)   Pulse 92   Temp 98.7 F (37.1 C) (Oral)   Resp 16   SpO2 99%   Physical Exam  Constitutional: She is oriented to person, place, and time. She appears well-developed and well-nourished.  HENT:  Head: Normocephalic and atraumatic.  Eyes: Conjunctivae are normal. Pupils are equal, round, and reactive to light. Right eye exhibits no discharge. Left eye exhibits no discharge. No scleral icterus.  Neck: Normal range of motion. No JVD present. No tracheal deviation present.  Pulmonary/Chest: Effort normal. No stridor.  Musculoskeletal:  90 of rotation of the right fifth toe,  skin tear to the inter digit space  Neurological: She is alert and oriented to person, place, and time. Coordination normal.  Psychiatric: She has a normal mood and affect. Her behavior is normal. Judgment and thought content normal.  Nursing note and vitals reviewed.    ED Treatments / Results  Labs (all labs ordered are listed, but only abnormal results are displayed) Labs Reviewed - No data to display  EKG  EKG Interpretation None       Radiology Dg Foot Complete Right  Result Date: 05/25/2016 CLINICAL DATA:   Stubbed fifth toe on post, initial encounter EXAM: RIGHT FOOT COMPLETE - 3+ VIEW COMPARISON:  02/25/2013 FINDINGS: There is a comminuted fracture of the fifth proximal phalanx extending into the metatarsal-phalangeal joint. Some lateral angulation of the distal fracture fragment is noted. No gross soft tissue abnormality is seen. IMPRESSION: Comminuted fracture at the base of the fifth proximal phalanx with angulation laterally of the distal fracture fragment. Electronically Signed   By: Alcide Clever M.D.   On: 05/25/2016 14:06   Procedures Procedures (including critical care time)  NERVE BLOCK Performed by: Thermon Leyland Consent: Verbal consent obtained. Required items: required blood products, implants, devices, and special equipment available Time out: Immediately prior to procedure a "time out" was called to verify the correct patient, procedure, equipment, support staff and site/side marked as required.  Indication: fracture reduction / laceration repair  Nerve block body site: right fifth toe  Preparation: Patient was prepped and draped in the usual sterile fashion. Needle gauge: 25 G Location technique: anatomical landmarks  Local anesthetic: lidocaine  Anesthetic total: 4 ml  Outcome: pain improved Patient tolerance: Patient tolerated the procedure well with no immediate complications.  LACERATION REPAIR Performed by: Thermon Leyland Authorized by: Thermon Leyland Consent: Verbal consent obtained. Risks and benefits: risks, benefits and alternatives were discussed Consent given by: patient Patient identity confirmed: provided demographic data Prepped and Draped in normal sterile fashion Wound explored  Laceration Location: 4/5th right interdigit space  Laceration Length: 3 cm  No Foreign Bodies seen or palpated  Anesthesia: local infiltration  Local anesthetic: lidocaine 2%  Anesthetic total: 4 ml  Irrigation method: syringe Amount of cleaning:  standard  Skin closure: simple  Number of sutures: 3  Technique: simple interrupted   Patient tolerance: Patient tolerated the procedure well with no immediate complications.    Medications Ordered in ED Medications  lidocaine (XYLOCAINE) 2 % (with pres) injection 400 mg (400 mg Infiltration Given by Other 05/25/16 1340)  fentaNYL (SUBLIMAZE) injection 100 mcg (100 mcg Intravenous Given 05/25/16 1439)  sulfamethoxazole-trimethoprim (BACTRIM DS,SEPTRA DS) 800-160 MG per tablet 1 tablet (1 tablet Oral Given 05/25/16 1450)     Initial Impression / Assessment and Plan / ED Course  I have reviewed the triage vital signs and the nursing notes.  Pertinent labs & imaging results that were available during my care of the patient were reviewed by me and considered in my medical decision making (see chart for details).  Clinical Course      Final Clinical Impressions(s) / ED Diagnoses   Final diagnoses:  Open fracture   Labs:  Imaging:DG for complete  Consults:  Therapeutics: Bactrim, lidocaine, fentanyl  Discharge Meds: Percocet, Bactrim  Assessment/Plan:   30 year old female presents today with open fracture. Patient has a comminuted fracture of the base of the fifth proximal phalanx with angulation. She also has a laceration over the interdigit space. Digital block performed, wound was copiously irrigated with sterile water.  Dr. Magnus Ivan with orthopedics was consult at 2 instructed to have the toe reduced, irrigated, with loose stitches and close follow-up in his office. Patient placed on Bactrim, toe reduced here in the ED and buddy taped. Patient is given strict return precautions, follow-up information for Dr. Magnus Ivan. Both the patient and her family verbalized understanding and agreement to today's plan had no further questions or concerns at time of discharge.    New Prescriptions Discharge Medication List as of 05/25/2016  5:08 PM    START taking these medications    Details  oxyCODONE-acetaminophen (PERCOCET/ROXICET) 5-325 MG tablet Take 1 tablet by mouth every 6 (six) hours as needed for severe pain., Starting Sat 05/25/2016, Print    sulfamethoxazole-trimethoprim (BACTRIM DS,SEPTRA DS) 800-160 MG tablet Take 1 tablet by mouth 2 (two) times daily., Starting Sat 05/25/2016, Until Sat 06/01/2016, Print         Eyvonne Mechanic, PA-C 05/26/16 1041    Rolan Bucco, MD 05/26/16 1131

## 2020-12-18 ENCOUNTER — Other Ambulatory Visit: Payer: Self-pay | Admitting: Physician Assistant

## 2020-12-18 DIAGNOSIS — R112 Nausea with vomiting, unspecified: Secondary | ICD-10-CM

## 2020-12-19 ENCOUNTER — Other Ambulatory Visit: Payer: Self-pay

## 2020-12-19 ENCOUNTER — Ambulatory Visit
Admission: RE | Admit: 2020-12-19 | Discharge: 2020-12-19 | Disposition: A | Discharge status: Home / Self Care | Payer: Medicaid Other | Source: Ambulatory Visit | Attending: Physician Assistant | Admitting: Physician Assistant

## 2020-12-19 DIAGNOSIS — R112 Nausea with vomiting, unspecified: Secondary | ICD-10-CM

## 2021-07-02 IMAGING — US US ABDOMEN LIMITED RUQ/ASCITES
1 series · 14 of 25 positions shown · non-contrast
Comparison: None.

CLINICAL DATA: Nausea and vomiting x3 weeks

EXAM:
ULTRASOUND ABDOMEN LIMITED RIGHT UPPER QUADRANT

[Series 1: us abdomen limited ruq/ascites · 0.17mm/px · 14 of 46 slices shown]
[im 1/46]
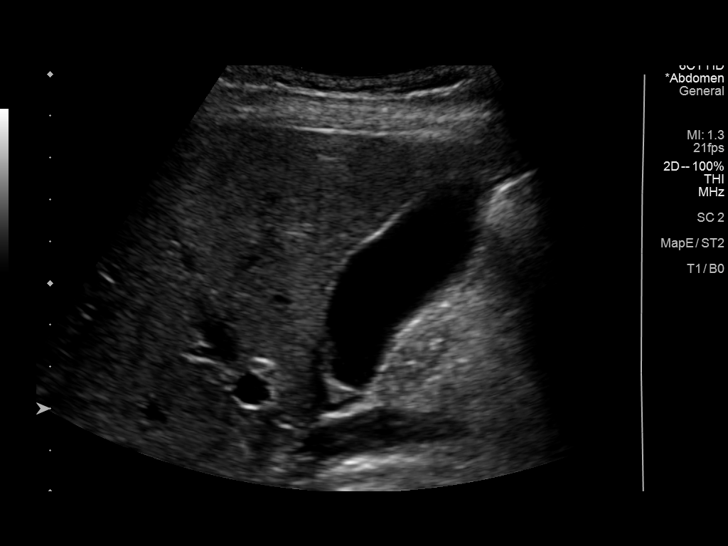
[im 4/46]
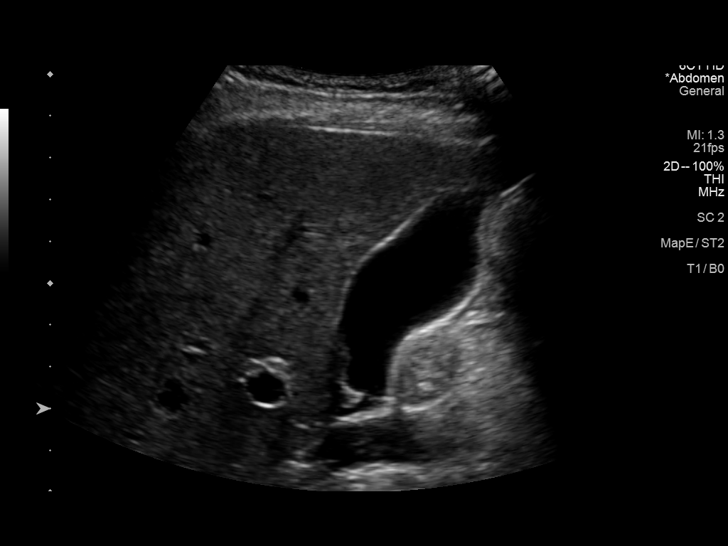
[im 8/46]
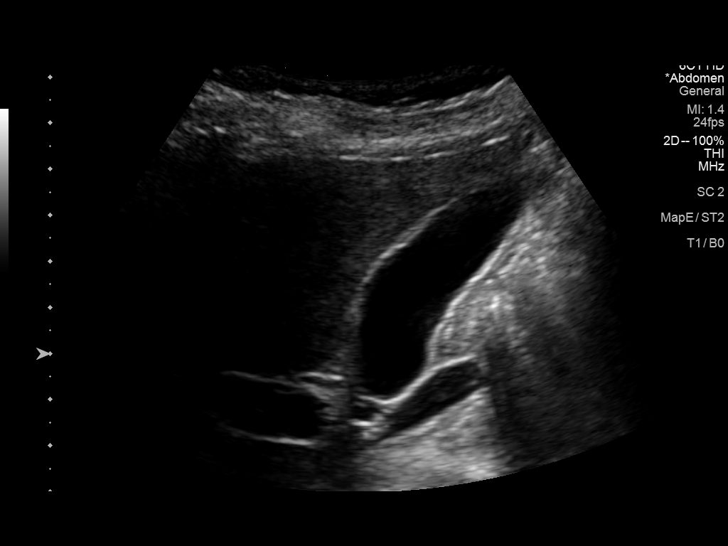
[im 12/46]
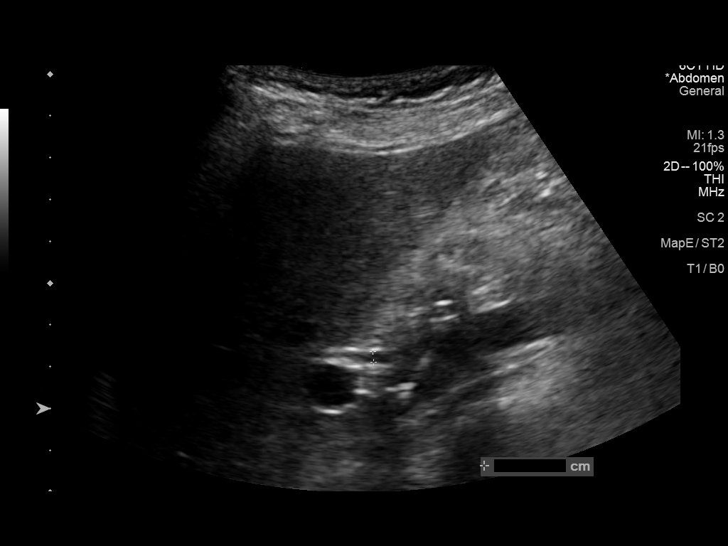
[im 16/46]
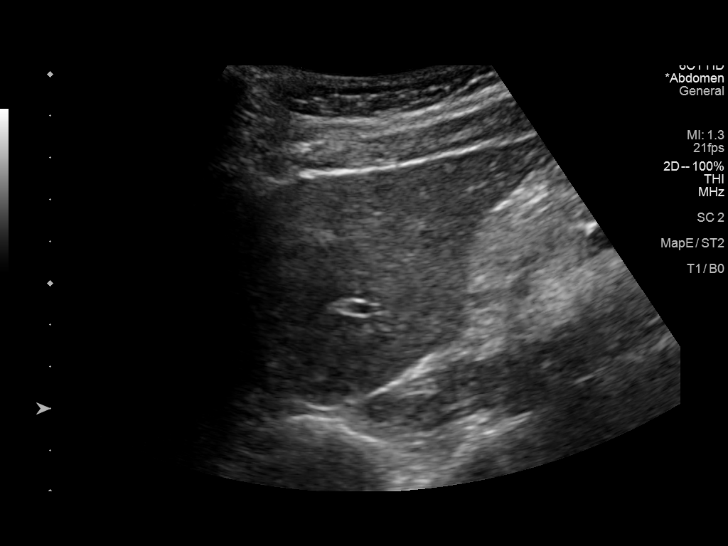
[im 17/46]
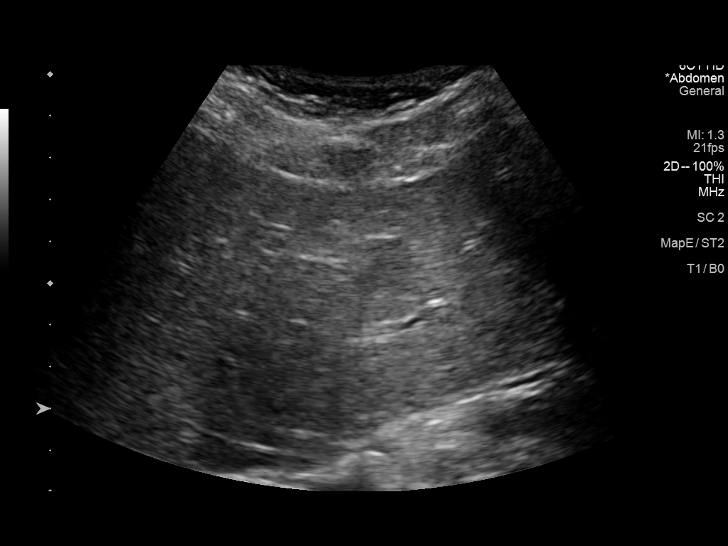
[im 21/46]
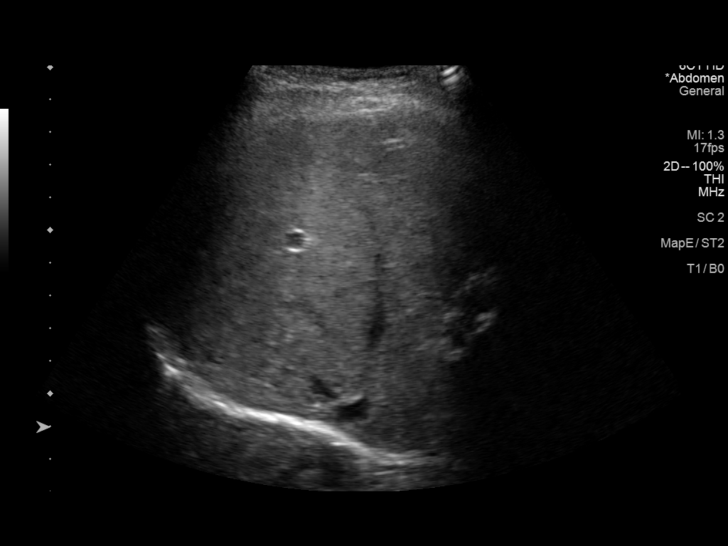
[im 25/46]
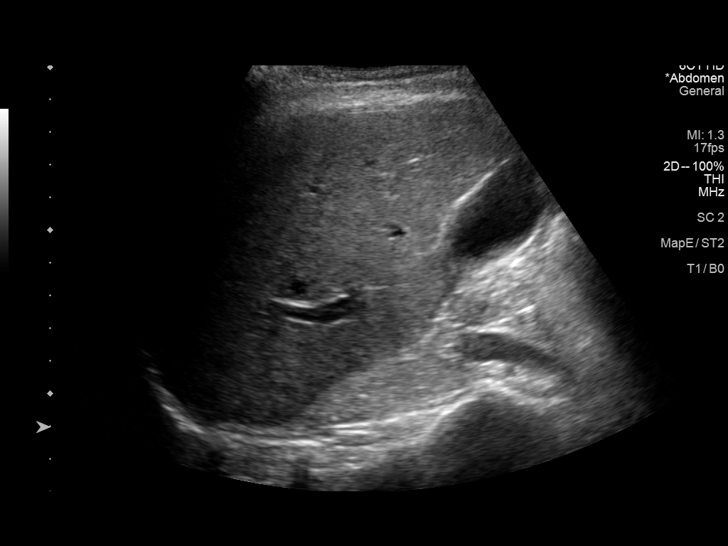
[im 29/46]
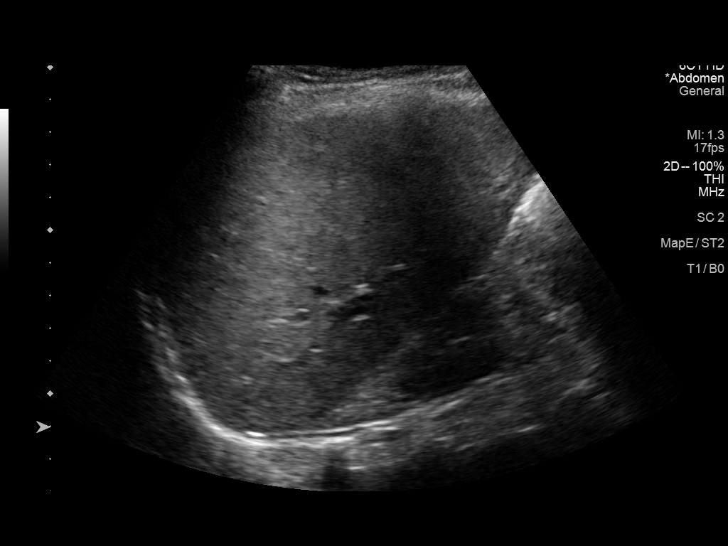
[im 31/46]
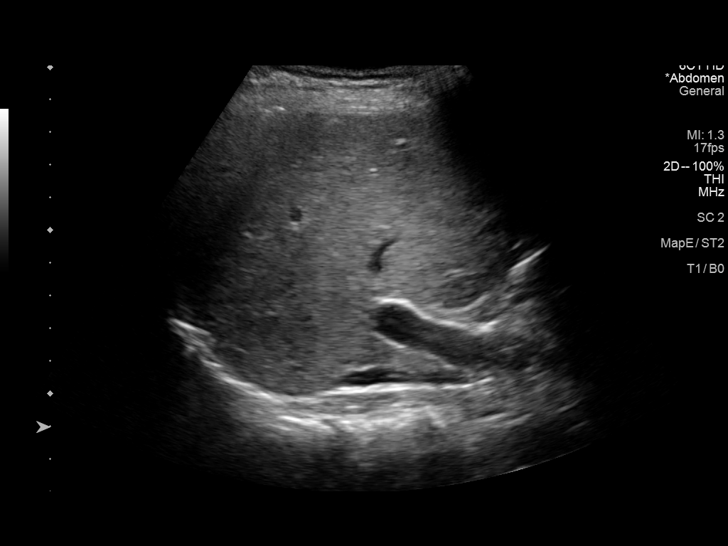
[im 34/46]
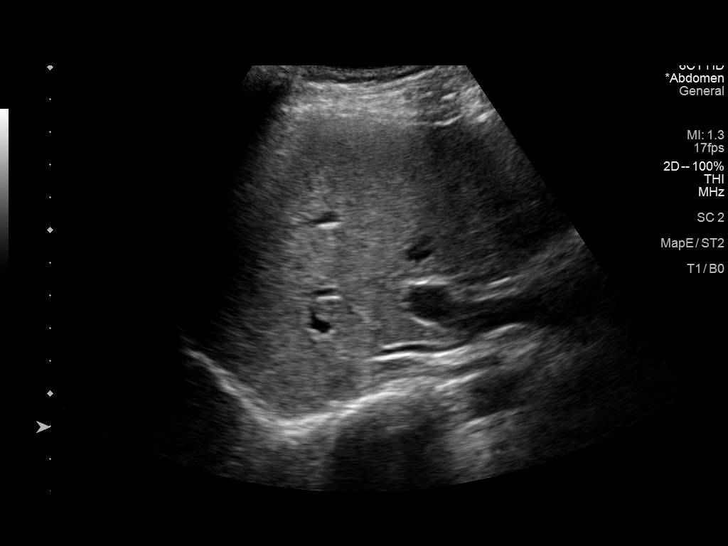
[im 38/46]
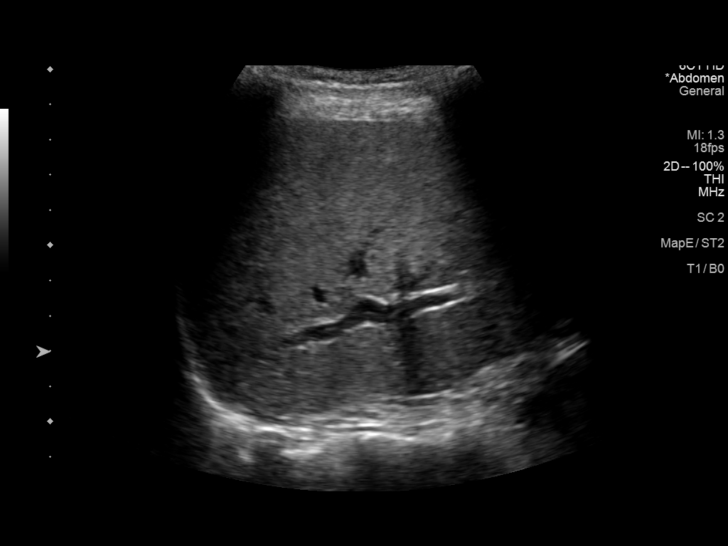
[im 42/46]
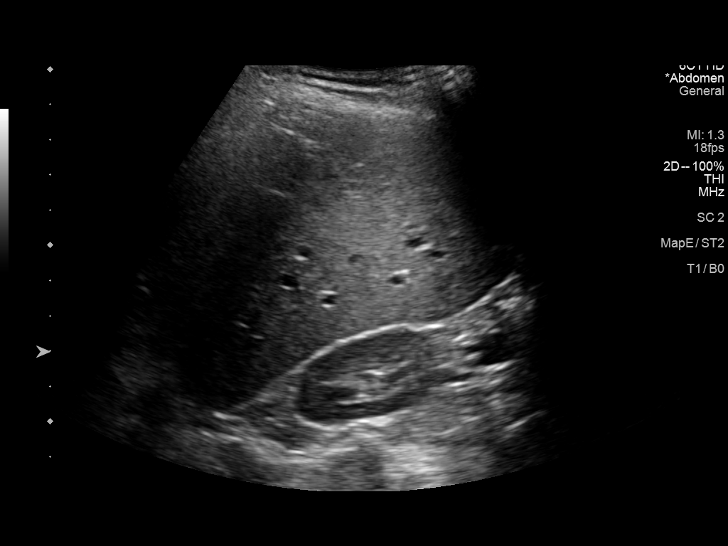
[im 46/46]
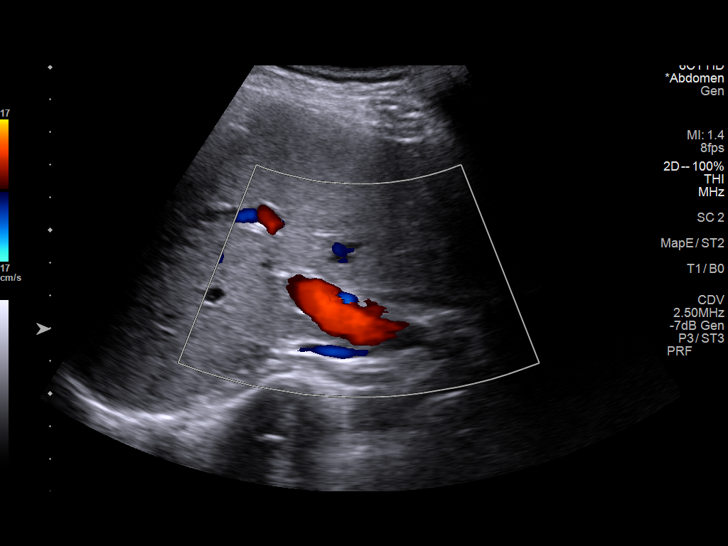

[14 of 25 positions shown; findings below may reference images not displayed]

FINDINGS: Gallbladder:

No gallstones or wall thickening visualized. No sonographic Murphy
sign noted by sonographer.

Common bile duct:

Diameter: 3 mm

Liver:

No focal lesion identified. Within normal limits in parenchymal
echogenicity. Portal vein is patent on color Doppler imaging with
normal direction of blood flow towards the liver.

Other: None.
IMPRESSION: Normal right upper quadrant ultrasound.

## 2022-12-29 ENCOUNTER — Encounter (HOSPITAL_BASED_OUTPATIENT_CLINIC_OR_DEPARTMENT_OTHER): Payer: Self-pay | Admitting: Urology

## 2022-12-29 ENCOUNTER — Emergency Department (HOSPITAL_BASED_OUTPATIENT_CLINIC_OR_DEPARTMENT_OTHER)
Admission: EM | Admit: 2022-12-29 | Discharge: 2022-12-29 | Disposition: A | Discharge status: Home / Self Care | Payer: Medicaid Other | Attending: Emergency Medicine | Admitting: Emergency Medicine

## 2022-12-29 ENCOUNTER — Other Ambulatory Visit: Payer: Self-pay

## 2022-12-29 DIAGNOSIS — R000 Tachycardia, unspecified: Secondary | ICD-10-CM | POA: Diagnosis not present

## 2022-12-29 DIAGNOSIS — R197 Diarrhea, unspecified: Secondary | ICD-10-CM | POA: Insufficient documentation

## 2022-12-29 DIAGNOSIS — R112 Nausea with vomiting, unspecified: Secondary | ICD-10-CM | POA: Diagnosis not present

## 2022-12-29 LAB — CBC WITH DIFFERENTIAL/PLATELET
Abs Immature Granulocytes: 0.04 10*3/uL (ref 0.00–0.07)
Basophils Absolute: 0 10*3/uL (ref 0.0–0.1)
Basophils Relative: 0 %
Eosinophils Absolute: 0 10*3/uL (ref 0.0–0.5)
Eosinophils Relative: 0 %
HCT: 40.7 % (ref 36.0–46.0)
Hemoglobin: 13.9 g/dL (ref 12.0–15.0)
Immature Granulocytes: 0 %
Lymphocytes Relative: 2 %
Lymphs Abs: 0.3 10*3/uL — ABNORMAL LOW (ref 0.7–4.0)
MCH: 28.8 pg (ref 26.0–34.0)
MCHC: 34.2 g/dL (ref 30.0–36.0)
MCV: 84.3 fL (ref 80.0–100.0)
Monocytes Absolute: 0.2 10*3/uL (ref 0.1–1.0)
Monocytes Relative: 2 %
Neutro Abs: 10.9 10*3/uL — ABNORMAL HIGH (ref 1.7–7.7)
Neutrophils Relative %: 96 %
Platelets: 233 10*3/uL (ref 150–400)
RBC: 4.83 MIL/uL (ref 3.87–5.11)
RDW: 12.7 % (ref 11.5–15.5)
WBC: 11.5 10*3/uL — ABNORMAL HIGH (ref 4.0–10.5)
nRBC: 0 % (ref 0.0–0.2)

## 2022-12-29 LAB — URINALYSIS, ROUTINE W REFLEX MICROSCOPIC
Bilirubin Urine: NEGATIVE
Glucose, UA: NEGATIVE mg/dL
Hgb urine dipstick: NEGATIVE
Ketones, ur: 15 mg/dL — AB
Leukocytes,Ua: NEGATIVE
Nitrite: NEGATIVE
Protein, ur: 30 mg/dL — AB
Specific Gravity, Urine: 1.025 (ref 1.005–1.030)
pH: 7 (ref 5.0–8.0)

## 2022-12-29 LAB — LIPASE, BLOOD: Lipase: 29 U/L (ref 11–51)

## 2022-12-29 LAB — URINALYSIS, MICROSCOPIC (REFLEX)

## 2022-12-29 LAB — PREGNANCY, URINE: Preg Test, Ur: NEGATIVE

## 2022-12-29 LAB — COMPREHENSIVE METABOLIC PANEL
ALT: 14 U/L (ref 0–44)
AST: 26 U/L (ref 15–41)
Albumin: 4.8 g/dL (ref 3.5–5.0)
Alkaline Phosphatase: 45 U/L (ref 38–126)
Anion gap: 8 (ref 5–15)
BUN: 20 mg/dL (ref 6–20)
CO2: 23 mmol/L (ref 22–32)
Calcium: 9 mg/dL (ref 8.9–10.3)
Chloride: 106 mmol/L (ref 98–111)
Creatinine, Ser: 0.81 mg/dL (ref 0.44–1.00)
GFR, Estimated: >60 mL/min (ref >60)
Glucose, Bld: 136 mg/dL — ABNORMAL HIGH (ref 70–99)
Potassium: 4.2 mmol/L (ref 3.5–5.1)
Sodium: 137 mmol/L (ref 135–145)
Total Bilirubin: 1 mg/dL (ref 0.3–1.2)
Total Protein: 8.1 g/dL (ref 6.5–8.1)

## 2022-12-29 MED ORDER — METOCLOPRAMIDE HCL 10 MG PO TABS: 10.0000 mg | ORAL_TABLET | Freq: Three times a day (TID) | ORAL | 0 refills | Status: AC | PRN

## 2022-12-29 MED ORDER — ONDANSETRON HCL 4 MG/2ML IJ SOLN
4.0000 mg | Freq: Once | INTRAMUSCULAR | Status: AC
  Administered 2022-12-29: 4 mg via INTRAVENOUS
  Filled 2022-12-29: qty 2

## 2022-12-29 MED ORDER — ONDANSETRON 4 MG PO TBDP: 4.0000 mg | ORAL_TABLET | Freq: Once | ORAL | Status: DC

## 2022-12-29 MED ORDER — SODIUM CHLORIDE 0.9 % IV BOLUS
1000.0000 mL | Freq: Once | INTRAVENOUS | Status: AC
  Administered 2022-12-29: 1000 mL via INTRAVENOUS

## 2022-12-29 NOTE — ED Provider Notes (Signed)
[facility] [place] Provider Note   CSN: [id number] Arrival date & time: 12/29/22  1702     History  Chief Complaint  Patient presents with   Emesis    Bianca Scott is a 37 y.o. female.   Emesis    37 year old female significant history of migraine, developmental delay, presenting with complaint of nausea or vomiting.  Patient went to eat at a [employer] last night.  Earlier this morning she felt nauseous and proceeded to vomit multiple episodes of nonbloody nonbilious vomiting as well as having several bouts of loose stools.  She endorsed some achiness to her back and her legs.  She tries using her Phenergan p.o. and suppository at home but unable to find relief.  She tried some Tylenol as well.  Since waiting in the ER, she mention her symptoms seems to be improving.  She denies any runny nose sneezing or coughing no chest pain or shortness of breath no dysuria hematuria no vaginal bleeding or vaginal discharge.  No one else is sick.  Home Medications Prior to Admission medications   Medication Sig Start Date End Date Taking? Authorizing Provider  cetirizine (ZYRTEC) 10 MG tablet Take 10 mg by mouth daily as needed. For allergies    [provider]  doxycycline (VIBRAMYCIN) 100 MG capsule Take 100 mg by mouth 2 (two) times daily. For 14 days to be completed 01/06/12    [provider]  EPINEPHrine (EPIPEN) 0.3 mg/0.3 mL DEVI Inject 0.3 mg into the muscle once.    [provider]  ibuprofen (ADVIL,MOTRIN) 200 MG tablet Take 400 mg by mouth every 6 (six) hours as needed. For cramps    [provider]  medroxyPROGESTERone (DEPO-PROVERA) 150 MG/ML injection Inject 150 mg into the muscle every 3 (three) months.    [provider]  ondansetron (ZOFRAN) 4 MG tablet Take 1 tablet (4 mg total) by mouth every 6 (six) hours. 11/23/12   Mabe, Forbes Cellar, MD  ondansetron (ZOFRAN-ODT) 4 MG disintegrating  tablet Take 4 mg by mouth every 8 (eight) hours as needed. For nausea    [provider]  oxyCODONE-acetaminophen (PERCOCET/ROXICET) 5-325 MG tablet Take 1 tablet by mouth every 6 (six) hours as needed for severe pain. 05/25/16   Hedges, Dellis Filbert, PA-C      Allergies    Ventolin [kdc:albuterol] and Duricef [cefadroxil monohydrate]    Review of Systems   Review of Systems  Gastrointestinal:  Positive for vomiting.  All other systems reviewed and are negative.   Physical Exam Updated Vital Signs BP 112/81 (BP Location: Left Arm)   Pulse (!) 103   Temp 98.4 F (36.9 C) (Oral)   Resp 18   Ht '5\' 3"'$  (1.6 m)   Wt 40.8 kg   SpO2 99%   BMI 15.93 kg/m  Physical Exam Vitals and nursing note reviewed.  Constitutional:      General: She is not in acute distress.    Appearance: She is well-developed.  HENT:     Head: Atraumatic.  Eyes:     Conjunctiva/sclera: Conjunctivae normal.  Cardiovascular:     Rate and Rhythm: Normal rate and regular rhythm.     Pulses: Normal pulses.     Heart sounds: Normal heart sounds.  Pulmonary:     Effort: Pulmonary effort is normal.  Abdominal:     Palpations: Abdomen is soft.     Tenderness: There is no abdominal tenderness.  Musculoskeletal:  Cervical back: Neck supple.  Skin:    Findings: No rash.  Neurological:     Mental Status: She is alert. Mental status is at baseline.  Psychiatric:        Mood and Affect: Mood normal.     ED Results / Procedures / Treatments   Labs (all labs ordered are listed, but only abnormal results are displayed) Labs Reviewed  CBC WITH DIFFERENTIAL/PLATELET - Abnormal; Notable for the following components:      Result Value   WBC 11.5 (*)    Neutro Abs 10.9 (*)    Lymphs Abs 0.3 (*)    All other components within normal limits  COMPREHENSIVE METABOLIC PANEL - Abnormal; Notable for the following components:   Glucose, Bld 136 (*)    All other components within normal limits  URINALYSIS,  ROUTINE W REFLEX MICROSCOPIC - Abnormal; Notable for the following components:   APPearance HAZY (*)    Ketones, ur 15 (*)    Protein, ur 30 (*)    All other components within normal limits  URINALYSIS, MICROSCOPIC (REFLEX) - Abnormal; Notable for the following components:   Bacteria, UA FEW (*)    All other components within normal limits  RESP PANEL BY RT-PCR (RSV, FLU A&B, COVID)  RVPGX2  LIPASE, BLOOD  PREGNANCY, URINE    EKG None  Radiology No results found.  Procedures Procedures    Medications Ordered in ED Medications  ondansetron (ZOFRAN-ODT) disintegrating tablet 4 mg (0 mg Oral Hold 12/29/22 1712)  ondansetron (ZOFRAN) injection 4 mg (has no administration in time range)  sodium chloride 0.9 % bolus 1,000 mL (has no administration in time range)    ED Course/ Medical Decision Making/ A&P                             Medical Decision Making Amount and/or Complexity of Data Reviewed Labs: ordered.  Risk Prescription drug management.   BP 112/81 (BP Location: Left Arm)   Pulse (!) 103   Temp 98.4 F (36.9 C) (Oral)   Resp 18   Ht '5\' 3"'$  (1.6 m)   Wt 40.8 kg   SpO2 99%   BMI 15.93 kg/m   44:62 PM  37 year old female significant history of migraine, developmental delay, presenting with complaint of nausea or vomiting.  Patient went to eat at a [employer] last night.  Earlier this morning she felt nauseous and proceeded to vomit multiple episodes of nonbloody nonbilious vomiting as well as having several bouts of loose stools.  She endorsed some achiness to her back and her legs.  She tries using her Phenergan p.o. and suppository at home but unable to find relief.  She tried some Tylenol as well.  Since waiting in the ER, she mention her symptoms seems to be improving.  She denies any runny nose sneezing or coughing no chest pain or shortness of breath no dysuria hematuria no vaginal bleeding or vaginal discharge.  No one else is sick.  On exam this  is a well-appearing female resting comfortably in bed appears to be in no acute discomfort.  Heart with normal rate and rhythm, lungs clear to auscultation bilaterally abdomen soft nontender without any guarding or rebound tenderness.  Normal skin turgor.  Vital sign review remarkable for mild tachycardia with heart rate of 103.  Patient is afebrile, no hypoxia.  -Labs ordered, independently viewed and interpreted by me.  Labs remarkable for WBC 11.5, CBG  136, UA with 15 ketone and 30 protein.  Preg test negative -The patient was maintained on a cardiac monitor.  I personally viewed and interpreted the cardiac monitored which showed an underlying rhythm of: sinus tachycardia -Imaging including abd/pelvis CT considered but not performed.  Pt without significant abd pain, doubt appy or other acute pathology -This patient presents to the ED for concern of abd pain, this involves an extensive number of treatment options, and is a complaint that carries with it a high risk of complications and morbidity.  The differential diagnosis includes viral gastroenteritis, colitis, diverticulitis, cholecystitis, appendicitis -Co morbidities that complicate the patient evaluation includes developmental delay -Treatment includes IVF, antiemetic -Reevaluation of the patient after these medicines showed that the patient improved -PCP office notes or outside notes reviewed -Escalation to admission/observation considered: patients feels much better, is comfortable with discharge, and will follow up with PCP -Prescription medication considered, patient comfortable with reglan -Social Determinant of Health considered         Final Clinical Impression(s) / ED Diagnoses Final diagnoses:  Nausea vomiting and diarrhea    Rx / DC Orders ED Discharge Orders          Ordered    metoCLOPramide (REGLAN) 10 MG tablet  Every 8 hours PRN        12/29/22 2108              Domenic Moras, PA-C 12/29/22 2109     Lennice Sites, DO 12/29/22 2136

## 2022-12-29 NOTE — Discharge Instructions (Signed)
You have symptoms likely due to a viral stomach bug.  Your symptoms will improve over time.  Stay hydrated by drinking plenty of fluid.  You may take Reglan as needed for nausea but if you develop any, muscle twitches or uncontrollable movement please discontinue this medication.  Follow-up with your doctor for the care.  Return to the ER if you have any concern.

## 2022-12-29 NOTE — ED Triage Notes (Signed)
Pt states vomiting and diarrhea  since 0530 today  Denies fever  Also reports body aches that started today
